# Patient Record
Sex: Female | Born: 1975
Health system: Southern US, Community
[De-identification: ages and names within clinical notes are randomized; demographics above are authoritative.]

## PROBLEM LIST (undated history)

## (undated) DIAGNOSIS — N200 Calculus of kidney: Secondary | ICD-10-CM

## (undated) DIAGNOSIS — R519 Headache, unspecified: Secondary | ICD-10-CM

## (undated) DIAGNOSIS — K219 Gastro-esophageal reflux disease without esophagitis: Secondary | ICD-10-CM

## (undated) DIAGNOSIS — F419 Anxiety disorder, unspecified: Secondary | ICD-10-CM

## (undated) DIAGNOSIS — I10 Essential (primary) hypertension: Secondary | ICD-10-CM

## (undated) DIAGNOSIS — D241 Benign neoplasm of right breast: Secondary | ICD-10-CM

## (undated) DIAGNOSIS — I493 Ventricular premature depolarization: Secondary | ICD-10-CM

## (undated) DIAGNOSIS — K429 Umbilical hernia without obstruction or gangrene: Secondary | ICD-10-CM

## (undated) DIAGNOSIS — T7840XA Allergy, unspecified, initial encounter: Secondary | ICD-10-CM

## (undated) DIAGNOSIS — R51 Headache: Secondary | ICD-10-CM

## (undated) DIAGNOSIS — Z8489 Family history of other specified conditions: Secondary | ICD-10-CM

## (undated) HISTORY — PX: WISDOM TOOTH EXTRACTION: SHX21

## (undated) HISTORY — PX: CHOLECYSTECTOMY: SHX55

## (undated) HISTORY — DX: Essential (primary) hypertension: I10

## (undated) HISTORY — DX: Allergy, unspecified, initial encounter: T78.40XA

## (undated) HISTORY — PX: LASIK: SHX215

## (undated) HISTORY — PX: EYE SURGERY: SHX253

## (undated) HISTORY — PX: CYST EXCISION: SHX5701

## (undated) HISTORY — DX: Anxiety disorder, unspecified: F41.9

## (undated) HISTORY — PX: LITHOTRIPSY: SUR834

## (undated) HISTORY — PX: DILATION AND CURETTAGE OF UTERUS: SHX78

---

## 2002-03-21 ENCOUNTER — Encounter: Payer: Self-pay | Admitting: Obstetrics and Gynecology

## 2002-03-21 ENCOUNTER — Ambulatory Visit (HOSPITAL_COMMUNITY): Admission: RE | Admit: 2002-03-21 | Discharge: 2002-03-21 | Payer: Self-pay | Admitting: Obstetrics and Gynecology

## 2002-03-27 ENCOUNTER — Inpatient Hospital Stay (HOSPITAL_COMMUNITY): Admission: AD | Admit: 2002-03-27 | Discharge: 2002-03-27 | Payer: Self-pay | Admitting: Obstetrics and Gynecology

## 2002-04-17 ENCOUNTER — Ambulatory Visit (HOSPITAL_COMMUNITY): Admission: AD | Admit: 2002-04-17 | Discharge: 2002-04-17 | Payer: Self-pay | Admitting: Obstetrics and Gynecology

## 2003-08-14 ENCOUNTER — Emergency Department (HOSPITAL_COMMUNITY): Admission: EM | Admit: 2003-08-14 | Discharge: 2003-08-14 | Payer: Self-pay | Admitting: Family Medicine

## 2003-08-24 ENCOUNTER — Other Ambulatory Visit: Admission: RE | Admit: 2003-08-24 | Discharge: 2003-08-24 | Payer: Self-pay | Admitting: Obstetrics and Gynecology

## 2003-09-26 ENCOUNTER — Emergency Department (HOSPITAL_COMMUNITY): Admission: EM | Admit: 2003-09-26 | Discharge: 2003-09-26 | Payer: Self-pay | Admitting: *Deleted

## 2004-05-10 ENCOUNTER — Emergency Department (HOSPITAL_COMMUNITY): Admission: EM | Admit: 2004-05-10 | Discharge: 2004-05-10 | Payer: Self-pay | Admitting: Family Medicine

## 2004-08-22 ENCOUNTER — Other Ambulatory Visit: Admission: RE | Admit: 2004-08-22 | Discharge: 2004-08-22 | Payer: Self-pay | Admitting: Obstetrics and Gynecology

## 2004-10-10 ENCOUNTER — Inpatient Hospital Stay (HOSPITAL_COMMUNITY): Admission: AD | Admit: 2004-10-10 | Discharge: 2004-10-10 | Payer: Self-pay | Admitting: Obstetrics and Gynecology

## 2004-11-11 ENCOUNTER — Inpatient Hospital Stay (HOSPITAL_COMMUNITY): Admission: AD | Admit: 2004-11-11 | Discharge: 2004-11-12 | Payer: Self-pay | Admitting: Obstetrics and Gynecology

## 2005-02-09 ENCOUNTER — Ambulatory Visit (HOSPITAL_COMMUNITY): Admission: RE | Admit: 2005-02-09 | Discharge: 2005-02-09 | Payer: Self-pay | Admitting: Obstetrics and Gynecology

## 2005-02-13 DIAGNOSIS — N200 Calculus of kidney: Secondary | ICD-10-CM

## 2005-02-13 HISTORY — DX: Calculus of kidney: N20.0

## 2005-02-20 ENCOUNTER — Inpatient Hospital Stay (HOSPITAL_COMMUNITY): Admission: RE | Admit: 2005-02-20 | Discharge: 2005-02-23 | Payer: Self-pay | Admitting: Obstetrics and Gynecology

## 2005-02-20 ENCOUNTER — Encounter (INDEPENDENT_AMBULATORY_CARE_PROVIDER_SITE_OTHER): Payer: Self-pay | Admitting: *Deleted

## 2005-02-24 ENCOUNTER — Encounter: Admission: RE | Admit: 2005-02-24 | Discharge: 2005-03-26 | Payer: Self-pay | Admitting: Obstetrics and Gynecology

## 2005-03-16 ENCOUNTER — Emergency Department (HOSPITAL_COMMUNITY): Admission: EM | Admit: 2005-03-16 | Discharge: 2005-03-16 | Payer: Self-pay | Admitting: Family Medicine

## 2005-03-27 ENCOUNTER — Encounter: Admission: RE | Admit: 2005-03-27 | Discharge: 2005-04-23 | Payer: Self-pay | Admitting: Obstetrics and Gynecology

## 2005-04-03 ENCOUNTER — Encounter (INDEPENDENT_AMBULATORY_CARE_PROVIDER_SITE_OTHER): Payer: Self-pay | Admitting: Specialist

## 2005-04-03 ENCOUNTER — Ambulatory Visit (HOSPITAL_COMMUNITY): Admission: RE | Admit: 2005-04-03 | Discharge: 2005-04-03 | Payer: Self-pay | Admitting: Surgery

## 2005-04-24 ENCOUNTER — Encounter: Admission: RE | Admit: 2005-04-24 | Discharge: 2005-05-24 | Payer: Self-pay | Admitting: Obstetrics and Gynecology

## 2005-05-25 ENCOUNTER — Encounter: Admission: RE | Admit: 2005-05-25 | Discharge: 2005-06-23 | Payer: Self-pay | Admitting: Obstetrics and Gynecology

## 2005-06-24 ENCOUNTER — Encounter: Admission: RE | Admit: 2005-06-24 | Discharge: 2005-07-24 | Payer: Self-pay | Admitting: Obstetrics and Gynecology

## 2005-07-25 ENCOUNTER — Encounter: Admission: RE | Admit: 2005-07-25 | Discharge: 2005-08-23 | Payer: Self-pay | Admitting: Obstetrics and Gynecology

## 2006-02-24 ENCOUNTER — Emergency Department (HOSPITAL_COMMUNITY): Admission: EM | Admit: 2006-02-24 | Discharge: 2006-02-24 | Payer: Self-pay | Admitting: Family Medicine

## 2006-04-16 ENCOUNTER — Ambulatory Visit (HOSPITAL_COMMUNITY): Admission: RE | Admit: 2006-04-16 | Discharge: 2006-04-16 | Payer: Self-pay | Admitting: Urology

## 2006-09-08 ENCOUNTER — Emergency Department (HOSPITAL_COMMUNITY): Admission: EM | Admit: 2006-09-08 | Discharge: 2006-09-08 | Payer: Self-pay | Admitting: Emergency Medicine

## 2006-11-13 ENCOUNTER — Emergency Department (HOSPITAL_COMMUNITY): Admission: EM | Admit: 2006-11-13 | Discharge: 2006-11-13 | Payer: Self-pay | Admitting: Emergency Medicine

## 2007-02-04 ENCOUNTER — Emergency Department (HOSPITAL_COMMUNITY): Admission: EM | Admit: 2007-02-04 | Discharge: 2007-02-04 | Payer: Self-pay | Admitting: Emergency Medicine

## 2008-07-22 ENCOUNTER — Ambulatory Visit: Payer: Self-pay | Admitting: Cardiology

## 2008-07-23 ENCOUNTER — Ambulatory Visit: Payer: Self-pay | Admitting: Cardiology

## 2008-08-10 LAB — CONVERTED CEMR LAB
ALT: 23 units/L (ref 0–35)
AST: 28 units/L (ref 0–37)
Albumin: 4.2 g/dL (ref 3.5–5.2)
BUN: 18 mg/dL (ref 6–23)
Basophils Relative: 0.6 % (ref 0.0–3.0)
CO2: 28 meq/L (ref 19–32)
Calcium: 9.5 mg/dL (ref 8.4–10.5)
Chloride: 109 meq/L (ref 96–112)
Eosinophils Absolute: 0.1 10*3/uL (ref 0.0–0.7)
HCT: 36.7 % (ref 36.0–46.0)
Lymphocytes Relative: 36.7 % (ref 12.0–46.0)
Lymphs Abs: 1.9 10*3/uL (ref 0.7–4.0)
MCV: 91.4 fL (ref 78.0–100.0)
Monocytes Absolute: 0.4 10*3/uL (ref 0.1–1.0)
Potassium: 3.7 meq/L (ref 3.5–5.1)
RBC: 4.02 M/uL (ref 3.87–5.11)
Total Bilirubin: 1.5 mg/dL — ABNORMAL HIGH (ref 0.3–1.2)
Total Protein: 7.2 g/dL (ref 6.0–8.3)

## 2010-01-22 ENCOUNTER — Emergency Department (HOSPITAL_COMMUNITY)
Admission: EM | Admit: 2010-01-22 | Discharge: 2010-01-22 | Payer: Self-pay | Source: Home / Self Care | Admitting: Family Medicine

## 2010-03-29 ENCOUNTER — Other Ambulatory Visit: Payer: Self-pay | Admitting: Obstetrics and Gynecology

## 2010-07-01 NOTE — Consult Note (Signed)
NAMESANDRA, Andrea Gray                ACCOUNT NO.:  [id number]   MEDICAL RECORD NO.:  [id number]          PATIENT TYPE:  EMS   LOCATION:  MAJO                         FACILITY:  [facility]   PHYSICIAN:  Gabrielle Dare. Janee Morn, M.D.DATE OF BIRTH:  [birth year]   DATE OF CONSULTATION:  03/16/2005  DATE OF DISCHARGE:  03/16/2005                                   CONSULTATION   REFERRING PHYSICIAN:  Quita Skye. Artis Flock, M.D.   REASON FOR CONSULTATION:  Gallstones and right upper quadrant pain.   HISTORY OF PRESENT ILLNESS:  The patient is a 35 year old white female who  is 3 weeks postpartum, status post a cesarean section for twins, who  developed some right upper quadrant pain earlier today after eating chili.  The patient was initially seen at [facility] and evaluated  further in the [facility] Emergency Department.  Workup included white blood  cell count of 8.8, liver function tests -- AST 52, ALT 35, alkaline  phosphatase 97, bilirubin 1.3.  Ultrasound of the abdomen was then obtained;  this shows gallstones with no evidence of acute cholecystitis.  The  patient's pain has completely resolved at this time and she has no other  complaints.   PAST MEDICAL HISTORY:  Negative.   PAST SURGICAL HISTORY:  1.  LASIK.  2.  Cesarean section.   CURRENT MEDICATIONS:  Vitamins and ibuprofen.   ALLERGIES:  PENICILLIN.   REVIEW OF SYSTEMS:  CARDIAC:  Negative.  PULMONARY:  Negative.  GI:  See the  history of present illness.  GU:  Negative.   PHYSICAL EXAMINATION:  VITAL SIGNS:  Temperature is 98.5, blood pressure  128/71, pulse 86, respirations 16 and saturation is 97%.  GENERAL:  She is awake and alert and well-appearing.  HEENT:  Pupils are equal and reactive.  Sclerae are clear.  NECK:  Supple with no tenderness.  LUNGS:  Clear to auscultation bilaterally.  HEART:  Regular and pulses palpable in the left chest.  ABDOMEN:  Soft.  There is no appreciable tenderness.  She has a C-section  scar, but no masses are noted.  SKIN:  Warm and dry with no rashes.   DATA REVIEW:  Data reviewed included laboratory studies and ultrasound  results as above.   IMPRESSION:  Symptomatic cholelithiasis with pain resolved at this time.   RECOMMENDATIONS:  Okay to discharge home.  The patient wishes to go home at  this time as she is breast-feeding.  We will have her follow up in [facility] office with one of my partners over the next few  days and she was given instructions and a card to call.  In the interim, she  is going to stay on a low-fat diet and to give [place] a call if her symptoms  return.  Further plans for laparoscopic cholecystectomy in the future were  discussed and questions were answered.  The patient's husband is a  Teacher, early years/pre.      Gabrielle Dare. Janee Morn, M.D.  Electronically Signed     BET/MEDQ  D:  03/16/2005  T:  03/17/2005  Job:  [id number] 

## 2010-07-01 NOTE — Discharge Summary (Signed)
NAMETREZURE, CRONK                ACCOUNT NO.:  [id number]   MEDICAL RECORD NO.:  [id number]          PATIENT TYPE:  INP   LOCATION:  9104                          [facility]ACILITY:  [facility]   PHYSICIAN:  Miguel Aschoff, M.D.       DATE OF BIRTH:  [birth year]   DATE OF ADMISSION:  02/20/2005  DATE OF DISCHARGE:  02/23/2005                                 DISCHARGE SUMMARY   FINAL DIAGNOSES:  1.  Twin gestation at 63 and five-sevenths weeks gestation.  2.  Vertex/transverse lie.  3.  The patient desires cesarean section.   PROCEDURE:  Primary low transverse cesarean section. Surgeon:  Dr. Carrington Clamp. Assistant:  Dr. Ilda Mori. Complications:  None.   This 35 year old G4 P0-0-3-0 presents at 35-and-a-half weeks gestation for a  cesarean section. The patient's antepartum course at this point had been  complicated by a history of IVF. The patient did have a twin gestation with  this IVF. The patient had serial ultrasounds which showed symmetric growth.  She did have a group B strep culture obtained at 35 weeks; I do not see the  results in the chart. She was scheduled for the cesarean section on February 20, 2005. She was taken to the operating room by Dr. Carrington Clamp on  February 20, 2005, where a primary low transverse cesarean section was  performed with the delivery of baby boy A in a vertex presentation weighing  5 pounds 10 ounces with Apgars of 8 and 9. At that point baby B was  delivered vertex as well weighing 5 pounds 8 ounces with Apgars of 7 and 8.  Delivery went without complications. The patient's postoperative course was  benign without any significant fevers. She was felt ready for discharge on  postoperative day #3. She was sent home on a regular diet, told to decrease  activities, told to continue her prenatal vitamins and iron supplement, was  given Tylox one to two every 4 hours as needed for pain, told she could use  over-the-counter Motrin up to 600 mg every 6 hours  as needed for pain, was  to follow up in the office in 4 weeks.   LABORATORY ON DISCHARGE:  The patient had a hemoglobin of 8.5; a white blood  cell count of 11.0; platelets of 176,000.      Leilani Able, P.A.-C.      Miguel Aschoff, M.D.  Electronically Signed    MB/MEDQ  D:  03/08/2005  T:  03/08/2005  Job:  [id number]

## 2010-07-01 NOTE — Op Note (Signed)
NAMEMANDALYN, PASQUA                ACCOUNT NO.:  [id number]   MEDICAL RECORD NO.:  [id number]          PATIENT TYPE:  AMB   LOCATION:  DAY                          [facility]ACILITY[facility]   PHYSICIAN:  Thomas A. Cornett, M.D.DATE OF BIRTH:  [birth year]   DATE OF PROCEDURE:  04/03/2005  DATE OF DISCHARGE:                                 OPERATIVE REPORT   PREOPERATIVE DIAGNOSIS:  Symptomatic cholelithiasis.   POSTOPERATIVE DIAGNOSIS:  Symptomatic cholelithiasis.   PROCEDURE:  Laparoscopic cholecystectomy and intraoperative cholangiogram.   SURGEON:  Dr. Harriette Bouillon.   ASSISTANT:  Dr. Consuello Bossier.   ANESTHESIA:  General endotracheal anesthesia with 0.25% Sensorcaine.   ESTIMATED BLOOD LOSS:  10 mL.   DRAINS:  None.   INDICATIONS FOR PROCEDURE:  The patient is a 35 year old female 6 weeks  postpartum. She had a severe attack of symptomatic cholelithiasis about  three weeks ago and was seen in the office and felt that laparoscopic  cholecystectomy was indicated for symptomatic cholelithiasis. The procedure  was discussed with the patient as well as the risks and complications and  she agreed to proceed .   DESCRIPTION OF PROCEDURE:  The patient was brought to the operating room and  placed supine. The abdomen was prepped and draped in a sterile fashion after  induction of general endotracheal anesthesia. A 1 cm infraumbilical incision  was made, dissection was carried to her fascia, incision was made in her. We  then enlarged the incision and entered her abdominal cavity after opening  the peritoneum with Metzenbaum scissors. I swept my finger around and felt  no evidence of adhesion. Pursestring suture of #0 Vicryl was placed and an  11-mm Hassan cannula was placed under direct vision. Pneumoperitoneum was  created to 15 mmHg with CO2 and a laparoscope was placed. The patient was  placed in reverse Trendelenburg and rolled to her left. Intraoperative  laparoscopy revealed no  evidence of solid organ or hollow organ injury. A 5  mm subxiphoid port was placed under direct vision. Two other 5 mm ports were  placed in the right mid abdomen under direct vision. The gallbladder was  identified and grasped by its dome and retracted toward the patient's right  shoulder. There were some filmy adhesions from the duodenum to the  gallbladder and I was able to pull these away from the gallbladder to the  duodenum without injuring the duodenum. The infundibulum was then grasped  and dissection was begun at the junction of the cystic duct and gallbladder  infundibulum. We were able to dissect this out circumferentially and a clip  was placed in the gallbladder side. A small incision was made in the cystic  duct and intraoperative cholangiogram was performed using one-half strength  Hypaque dye and fluoroscopy using a Cook catheter through a separate stab  incision. Cholangiogram revealed free flow of contrast from the cystic duct  to the common duct, common hepatic ducts to the bifurcation and then down  through the duodenum without signs of stone structure or leakage. The  cholangiogram is complete and cholangiogram catheter was withdrawn. The  cystic duct was then triple clipped and divided. The cystic artery was  identified, dissected out and it was double clipped and divided. One small  posterior branch in the cystic artery was identified and it was single  clipped and divided. Cautery was used to dissect the gallbladder from the  gallbladder fossa. The gallbladder was then placed in a EndoCatch bag and  extracted through the umbilical port using the assistance of a 5-mm scope.  This was passed off the field. We then reinserted a 10-mm scope, reinspected  the bed and found it to be hemostatic. All oozing was controlled with  cautery. Irrigation was used and suctioned out until clear. The gallbladder  bed was dry with no signs of bleeding or bile leakage. At this point in   time, all ports were withdrawn with no evidence of port site bleeding. The  camera was withdrawn, the [facility] cannula was removed and the CO2 was  released. The umbilical port was then closed with a #0 Vicryl suture, 4-0  Monocryl was used to close all skin incisions. All sponge, needle and  instruments were found to be correct at this portion of the case. The  patient was awoke and taken to recovery in satisfactory condition.      Thomas A. Cornett, M.D.  Electronically Signed     TAC/MEDQ  D:  04/03/2005  T:  04/04/2005  Job:  [id number]   cc:   Carrington Clamp, M.D.  Fax: [phone]

## 2010-07-01 NOTE — Op Note (Signed)
NAMESHERAY, Andrea Gray                ACCOUNT NO.:  [id number]   MEDICAL RECORD NO.:  [id number]          PATIENT TYPE:  INP   LOCATION:  9104                          FACILITY[facility]  [facility]   PHYSICIAN:  Carrington Clamp, M.D. DATE OF BIRTH:  [birth year]   DATE OF PROCEDURE:  02/20/2005  DATE OF DISCHARGE:                                 OPERATIVE REPORT   PREOPERATIVE DIAGNOSIS:  Twins, vertex/transverse, desires cesarean section.   POSTOPERATIVE DIAGNOSIS:  Twins, vertex/transverse, desires cesarean  section.   PROCEDURES:  Primary low transverse cesarean section.   SURGEON:  Carrington Clamp, M.D.   ASSISTANT:  Dr. Arlyce Dice.   ANESTHESIA:  Was general.   SPECIMENS:  Placenta.   ESTIMATED BLOOD LOSS:  Was 800 mL.   IV FLUIDS:  2800 mL.   URINE OUTPUT:  Was 200 mL.   COMPLICATIONS:  None.   FINDINGS:  Baby boy A vertex presentation, Apgars 8 and 9, weight 5 pounds  10 ounces. Baby B delivered vertex Apgars 07/08, weight 5 pounds 8 ounces.  Normal tubes, ovaries and uterus were seen.   MEDICATIONS:  Were Pitocin and Clindamycin. Counts were correct x3.   ANESTHESIA:  Spinal.   TECHNIQUE:  After adequate spinal anesthesia was achieved, the patient was  prepped, draped in sterile fashion in dorsal supine position with leftward  tilt. Pfannenstiel skin incision was made with the scalpel and carried down  to the fascia with the Bovie cautery. The fascia was incised in the midline  with the scalpel and carried in transverse curvilinear manner with the Mayo  scissors. Fascia was reflected superiorly inferiorly from the rectus muscles  and the rectus muscles were split in the midline. A bowel free portion of  peritoneum was entered into bluntly and the peritoneum then stretched open.  The bladder blade was placed and vesicouterine fascia was tented up and  entered into with the Metzenbaum scissors and then incised in transverse  curvilinear manner. The bladder flap was created with  blunt dissection. The  bladder blade replaced.   A 2 cm incision was made in the upper portion lower uterine segment  transversely until clear fluid was noted. This incision was extended with  the bandage scissors. Baby A left was delivered vertex and bulb suctioned  and handed to awaiting pediatrics. Clear fluid was noted on entry to sac A.  Baby B then came down vertex and was delivered after the amnion had been  ruptured. Clear fluid was seen. Baby was handed also to awaiting pediatrics.  Cord bloods were obtained. The placenta was sent to pathology.   The placenta was then delivered manually and sent to pathology. The uterus  was then exteriorized, wrapped in wet lap, cleared of all debris. The  uterine incision closed with running lock stitch of 0 Monocryl. Imbricating  layer of 0 Monocryl was used too. A figure-of-eight stitch was used to  ensure hemostasis. The uterus was then reapproximated in the abdomen. The  abdomen cleared of all debris with irrigation. The uterine incision  reinspected, found to be hemostatic.   Peritoneum was then closed with  running stitch 2-0 Vicryl. The fascia closed  with running stitch of 0 Vicryl. The subcutaneous tissue was rendered  hemostatic with Bovie cautery and irrigation. The skin was closed staples.  The patient tolerated the procedure well. She returned to recovery room in  stable condition.      Carrington Clamp, M.D.  Electronically Signed     MH/MEDQ  D:  02/20/2005  T:  02/20/2005  Job:  [id number]

## 2011-07-16 ENCOUNTER — Emergency Department (HOSPITAL_COMMUNITY)
Admission: EM | Admit: 2011-07-16 | Discharge: 2011-07-16 | Disposition: A | Discharge status: Home / Self Care | Payer: 59 | Source: Home / Self Care | Attending: Emergency Medicine | Admitting: Emergency Medicine

## 2011-07-16 ENCOUNTER — Encounter (HOSPITAL_COMMUNITY): Payer: Self-pay | Admitting: *Deleted

## 2011-07-16 ENCOUNTER — Emergency Department (INDEPENDENT_AMBULATORY_CARE_PROVIDER_SITE_OTHER): Payer: 59

## 2011-07-16 DIAGNOSIS — S63619A Unspecified sprain of unspecified finger, initial encounter: Secondary | ICD-10-CM

## 2011-07-16 DIAGNOSIS — S6390XA Sprain of unspecified part of unspecified wrist and hand, initial encounter: Secondary | ICD-10-CM

## 2011-07-16 NOTE — ED Provider Notes (Signed)
Chief Complaint  Patient presents with  . 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 She has pain and swelling over the PIP joint of the little finger. It's a little bit stiff but she is able to fully extend and fully flex. There is no numbness or tingling.  Review of Systems:  Other than noted above, the patient denies any of the following symptoms: Systemic:  No fevers, chills, sweats, or aches.  No fatigue or tiredness. Musculoskeletal:  No joint pain, arthritis, bursitis, swelling, back pain, or neck pain. Neurological:  No muscular weakness, paresthesias, headache, or trouble with speech or coordination.  No dizziness.   PMFSH:  Past medical history, family history, social history, meds, and allergies were reviewed.  Physical Exam:   Vital signs:  BP 123/83  Pulse 92  Temp(Src) 98 F (36.7 C) (Oral)  Resp 16  SpO2 100% Gen:  Alert and oriented times 3.  In no distress. Musculoskeletal: There is slight swelling and pain to palpation of the PIP joint of the right little finger. She is able to fully extend against resistance and can fully flex although it does hurt. Otherwise, all joints had a full a ROM with no swelling, bruising or deformity.  No edema, pulses full. Extremities were warm and pink.  Capillary refill was brisk.  Skin:  Clear, warm and dry.  No rash. Neuro:  Alert and oriented times 3.  Muscle strength was normal.  Sensation was intact to light touch.   Radiology:  Dg Finger Little Right  07/16/2011  *RADIOLOGY REPORT*  Clinical Data: Basketball injury, pain  RIGHT LITTLE FINGER 2+V  Comparison: None.  Findings: Normal alignment.  No fracture evident.  Preserved joint spaces.  No acute osseous finding.  IMPRESSION: No acute finding.  Original Report Authenticated By: Judie Petit. Ruel Favors, M.D.   Course in Urgent Care Center:   The finger  was placed in a finger splint in position of function.  Assessment:  The encounter diagnosis was Finger sprain.  Plan:   1.  The following meds were prescribed:   New Prescriptions   No medications on file   2.  The patient was instructed in symptomatic care, including rest and activity, elevation, application of ice and compression.  Appropriate handouts were given. 3.  The patient was told to return if becoming worse in any way, if no better in 3 or 4 days, and given some red flag symptoms that would indicate earlier return.   4.  The patient was told to follow up if necessary with her primary care physician.   Reuben Likes, MD 07/16/11 [phone]

## 2011-07-16 NOTE — ED Notes (Signed)
Pt playing basketball last night with children injured left 5th finger this am swelling and bruising increased pain

## 2011-07-16 NOTE — Discharge Instructions (Signed)
Finger Sprain A finger sprain is a tear in one of the strong, fibrous tissues that connect the bones (ligaments) in your finger. The severity of the sprain depends on how much of the ligament is torn. The tear can be either partial or complete. CAUSES  Often, sprains are a result of a fall or accident. If you extend your hands to catch an object or to protect yourself, the force of the impact causes the fibers of your ligament to stretch too much. This excess tension causes the fibers of your ligament to tear. SYMPTOMS  You may have some loss of motion in your finger. Other symptoms include:  Bruising.   Tenderness.   Swelling.  DIAGNOSIS  In order to diagnose finger sprain, your caregiver will physically examine your finger or thumb to determine how torn the ligament is. Your caregiver may also suggest an X-ray exam of your finger to make sure no bones are broken. TREATMENT  If your ligament is only partially torn, treatment usually involves keeping the finger in a fixed position (immobilization) for a short period. To do this, your caregiver will apply a bandage, cast, or splint to keep your finger from moving until it heals. For a partially torn ligament, the healing process usually takes 2 to 3 weeks. If your ligament is completely torn, you may need surgery to reconnect the ligament to the bone. After surgery a cast or splint will be applied and will need to stay on your finger or thumb for 4 to 6 weeks while your ligament heals. HOME CARE INSTRUCTIONS  Keep your injured finger elevated, when possible, to decrease swelling.   To ease pain and swelling, apply ice to your joint twice a day, for 2 to 3 days:   Put ice in a plastic bag.   Place a towel between your skin and the bag.   Leave the ice on for 15 minutes.   Only take over-the-counter or prescription medicine for pain as directed by your caregiver.   Do not wear rings on your injured finger.   Do not leave your finger  unprotected until pain and stiffness go away (usually 3 to 4 weeks).   Do not allow your cast or splint to get wet. Cover your cast or splint with a plastic bag when you shower or bathe. Do not swim.   Your caregiver may suggest special exercises for you to do during your recovery to prevent or limit permanent stiffness.  SEEK IMMEDIATE MEDICAL CARE IF:  Your cast or splint becomes damaged.   Your pain becomes worse rather than better.  MAKE SURE YOU:  Understand these instructions.   Will watch your condition.   Will get help right away if you are not doing well or get worse.  Document Released: 03/09/2004 Document Revised: 01/19/2011 Document Reviewed: 10/03/2010 [facility] Patient Information 2012 [place], [place].Finger Sprain A sprain is an injury where a ligament is over-stretched or torn. A ligament holds joints together. Finger sprains are a common injury among athletes. Sprains are classified into 3 categories. Grade 1 sprains cause pain, but the ligament is not lengthened. Grade 2 sprains include a lengthened ligament, due to stretching or partial tearing. With grade 2 sprains, there is still function, although function may be decreased. Grade 3 sprains are marked by a complete tear of the ligament. The joint usually suffers a loss of function. Severe sprains sometimes require surgery.  SYMPTOMS   Severe pain, at the time of injury.   Often, a  feeling of popping or tearing inside one or more fingers.   Tenderness, swelling, and later bruising in the finger.   Impaired ability to use the injured finger.  CAUSES  Stress, often from an unnatural degree of movement, exceeds the strength of the ligament, and the ligament is either stretched or torn. RISK INCREASES WITH:  Previous finger sprain or injury.   Contact sports and sports involving catching and throwing (i.e. baseball, basketball, football).   Poor hand strength and flexibility.   Inadequate or poorly fitting  protective equipment.  PREVENTION Taping, protective strapping, bracing, or splints may help prevent injury. PROGNOSIS  For first time injuries, sufficient healing time before resuming activity should prevent recurring injury or permanent impairment. Due to poor blood supply, ligaments do not heal well, and require a longer healing time than other structures, such as bone. Average healing times are as follows:  Grade 1: 2-6 weeks.   Grade 2: 8-12 weeks.   Grade 3: 12-16 weeks.  RELATED COMPLICATIONS   Longer healing time, if activity is resumed too soon.   Frequently recurring symptoms and repeated injury, resulting in a chronic problem.   Injury to other structures (bone, cartilage, or tendon).   Arthritis of the affected joint.   Prolonged impairment (sometimes).   Finger stiffness.  TREATMENT Treatment first consists of ice and medicine, to reduce pain and inflammation. Compression bandages and elevation may help reduce inflammation and discomfort. After swelling goes down, the joint should be restrained for a length of time prescribed by your caregiver. After restraint, stretching and strengthening exercises are needed. Exercises may be completed at home or with a therapist. Rarely, surgical treatment is needed. Taping may be advised, when returning to sports.  MEDICATION   If pain medicine is needed, nonsteroidal anti-inflammatory medicines (aspirin and ibuprofen), or other minor pain relievers (acetaminophen), are often advised.   Do not take pain medicine for 7 days before surgery.   Stronger pain relievers may be prescribed by your caregiver. Use only as directed and only as much as you need.  HEAT AND COLD  Cold treatment (icing) relieves pain and reduces inflammation. Cold treatment should be applied for 10 to 15 minutes every 2 to 3 hours, and immediately after activity that aggravates your symptoms. Use ice packs or an ice massage.   Heat treatment may be used before  performing stretching and strengthening activities prescribed by your caregiver, physical therapist, or athletic trainer. Use a heat pack or a warm water soak.  SEEK MEDICAL CARE IF:   Pain, swelling, or bruising gets worse, despite treatment, or you experience persistent pain lasting more than 2 to 4 weeks.   You experience pain, numbness, discoloration, or coldness in the hand or fingers. Blue, gray, or dark color appears in the fingernails.   Any of the following occur after surgery: increased pain, swelling, redness, drainage of fluids, bleeding in the affected area, or signs of infection, including fever.   New, unexplained symptoms develop. (Drugs used in treatment may produce side effects.)  Document Released: 01/30/2005 Document Revised: 01/19/2011 Document Reviewed: 05/14/2008 [facility] Patient Information 2012 [place], [place].

## 2013-03-07 ENCOUNTER — Ambulatory Visit (INDEPENDENT_AMBULATORY_CARE_PROVIDER_SITE_OTHER): Payer: 59

## 2013-03-07 ENCOUNTER — Encounter: Payer: Self-pay | Admitting: Podiatrist

## 2013-03-07 ENCOUNTER — Ambulatory Visit (INDEPENDENT_AMBULATORY_CARE_PROVIDER_SITE_OTHER): Payer: 59 | Admitting: Podiatrist

## 2013-03-07 DIAGNOSIS — M79609 Pain in unspecified limb: Secondary | ICD-10-CM

## 2013-03-07 DIAGNOSIS — M779 Enthesopathy, unspecified: Secondary | ICD-10-CM

## 2013-03-07 NOTE — Patient Instructions (Signed)
Wear your short boot for 2-3 weeks.  If you don't notice any improvement within that time, let me know.  We can consider a steroid injection--

## 2013-03-07 NOTE — Progress Notes (Signed)
   Subjective:    Patient ID: Andrea Gray, female    DOB: 03-Dec-1975, 38 y.o.   MRN: [id number]  HPI patient presents today complaining of pain along the left foot. She specifically has pain along the fourth metatarsal left foot and states " my left foot hurts along the side of the foot and has been going on for 3 months and feels like a cramp that wont stop and if I stand too long it hurts and some tenderness" she works as a Geophysicist/field seismologist and relates discomfort when standing. She denies any trauma or injury to the foot.    Review of Systems  Constitutional: Negative.   HENT: Negative.   Eyes: Negative.   Respiratory: Negative.   Cardiovascular: Negative.   Gastrointestinal: Negative.   Endocrine: Negative.   Genitourinary: Negative.   Musculoskeletal: Positive for back pain and neck pain.  Skin: Negative.   Allergic/Immunologic: Negative.   Neurological: Negative.   Hematological: Negative.   Psychiatric/Behavioral: Negative.        Objective:   Physical Exam Neurovascular status is intact bilateral feet with palpable pedal pulses and neurological sensation intact. She has a discrete area of discomfort and tenderness along the fourth metatarsal of the left foot. No pain with tuning fork is noted no pain with dorsal to plantar pressure on palpation is noted. X-rays are negative for fracture. No sign of dislocation is seen.      Assessment & Plan:  Tendinitis left foot  Plan: The patient wants to be aggressive with her treatment and therefore I put her in a short air fracture walker. She will wear this for 2-3 weeks. It is not improved in that time. She will call me and we can consider injection therapy. Otherwise this should be a self-limiting problem.

## 2013-04-17 ENCOUNTER — Other Ambulatory Visit: Payer: Self-pay | Admitting: Obstetrics and Gynecology

## 2013-10-22 ENCOUNTER — Other Ambulatory Visit: Payer: Self-pay | Admitting: Family Medicine

## 2013-10-22 ENCOUNTER — Ambulatory Visit
Admission: RE | Admit: 2013-10-22 | Discharge: 2013-10-22 | Disposition: A | Discharge status: Home / Self Care | Payer: 59 | Source: Ambulatory Visit | Attending: Family Medicine | Admitting: Family Medicine

## 2013-10-22 DIAGNOSIS — R05 Cough: Secondary | ICD-10-CM

## 2013-10-22 DIAGNOSIS — R059 Cough, unspecified: Secondary | ICD-10-CM

## 2013-10-27 ENCOUNTER — Ambulatory Visit (INDEPENDENT_AMBULATORY_CARE_PROVIDER_SITE_OTHER): Payer: Self-pay | Admitting: General Surgery

## 2014-04-17 ENCOUNTER — Ambulatory Visit: Payer: 59 | Admitting: Podiatrist

## 2014-05-01 ENCOUNTER — Ambulatory Visit: Payer: 59 | Admitting: Podiatrist

## 2014-07-01 ENCOUNTER — Other Ambulatory Visit: Payer: Self-pay | Admitting: Family

## 2014-07-01 DIAGNOSIS — N6310 Unspecified lump in the right breast, unspecified quadrant: Secondary | ICD-10-CM

## 2014-07-02 ENCOUNTER — Other Ambulatory Visit: Payer: Self-pay | Admitting: Family

## 2014-07-02 ENCOUNTER — Ambulatory Visit
Admission: RE | Admit: 2014-07-02 | Discharge: 2014-07-02 | Disposition: A | Discharge status: Home / Self Care | Payer: 59 | Source: Ambulatory Visit | Attending: Family | Admitting: Family

## 2014-07-02 DIAGNOSIS — N6310 Unspecified lump in the right breast, unspecified quadrant: Secondary | ICD-10-CM

## 2014-07-16 ENCOUNTER — Ambulatory Visit
Admission: RE | Admit: 2014-07-16 | Discharge: 2014-07-16 | Disposition: A | Discharge status: Home / Self Care | Payer: 59 | Source: Ambulatory Visit | Attending: Family | Admitting: Family

## 2014-07-16 DIAGNOSIS — N6310 Unspecified lump in the right breast, unspecified quadrant: Secondary | ICD-10-CM

## 2015-02-04 ENCOUNTER — Telehealth: Payer: 59 | Admitting: Physician Assistant

## 2015-02-04 DIAGNOSIS — J069 Acute upper respiratory infection, unspecified: Secondary | ICD-10-CM | POA: Diagnosis not present

## 2015-02-04 NOTE — Progress Notes (Signed)
Patient with cold symptoms potentially a viral URI. Is out of state in [place] and therefore cannot treat. Supportive measures will be given to patient.

## 2015-02-06 MED ORDER — AZITHROMYCIN 250 MG PO TABS: ORAL_TABLET | ORAL | Status: DC

## 2015-02-06 NOTE — Addendum Note (Signed)
Addended by: Chevis Pretty on: 02/06/2015 01:08 PM   Modules accepted: Orders

## 2015-02-06 NOTE — Progress Notes (Signed)

## 2015-02-12 ENCOUNTER — Telehealth: Payer: 59 | Admitting: Family

## 2015-02-12 DIAGNOSIS — J069 Acute upper respiratory infection, unspecified: Secondary | ICD-10-CM | POA: Diagnosis not present

## 2015-02-12 MED ORDER — LEVOFLOXACIN 500 MG PO TABS: 500.0000 mg | ORAL_TABLET | Freq: Every day | ORAL | Status: DC

## 2015-02-12 MED ORDER — BENZONATATE 100 MG PO CAPS: 100.0000 mg | ORAL_CAPSULE | Freq: Three times a day (TID) | ORAL | Status: DC | PRN

## 2015-02-12 NOTE — Progress Notes (Signed)
We are sorry that you are not feeling well.  Here is how we plan to help!  Based on what you have shared with me it looks like you have upper respiratory tract inflammation that has resulted in a significant cough.  Inflammation and infection in the upper respiratory tract is commonly called bronchitis and has four common causes:  Allergies, Viral Infections, Acid Reflux and Bacterial Infections.  Allergies, viruses and acid reflux are treated by controlling symptoms or eliminating the cause. An example might be a cough caused by taking certain blood pressure medications. You stop the cough by changing the medication. Another example might be a cough caused by acid reflux. Controlling the reflux helps control the cough.  Based on your presentation I believe you most likely have A cough due to bacteria.  When patients have a fever and a productive cough with a change in color or increased sputum production, we are concerned about bacterial bronchitis.  If left untreated it can progress to pneumonia.  If your symptoms do not improve with your treatment plan it is important that you contact your provider.   I hve prescribed Levofloxacin 500 mg daily for 7 days   In addition you may use A prescription cough medication called Tessalon Perles 100mg . You may take 1-2 capsules every 8 hours as needed for your cough.    HOME CARE . Only take medications as instructed by your medical team. . Complete the entire course of an antibiotic. . Drink plenty of fluids and get plenty of rest. . Avoid close contacts especially the very young and the elderly . Cover your mouth if you cough or cough into your sleeve. . Always remember to wash your hands . A steam or ultrasonic humidifier can help congestion.    GET HELP RIGHT AWAY IF: . You develop worsening fever. . You become short of breath . You cough up blood. . Your symptoms persist after you have completed your treatment plan MAKE SURE YOU   Understand  these instructions.  Will watch your condition.  Will get help right away if you are not doing well or get worse.  Your e-visit answers were reviewed by a board certified advanced clinical practitioner to complete your personal care plan.  Depending on the condition, your plan could have included both over the counter or prescription medications. If there is a problem please reply  once you have received a response from your provider. Your safety is important to [place].  If you have drug allergies check your prescription carefully.    You can use MyChart to ask questions about today's visit, request a non-urgent call back, or ask for a work or school excuse for 24 hours related to this e-Visit. If it has been greater than 24 hours you will need to follow up with your provider, or enter a new e-Visit to address those concerns. You will get an e-mail in the next two days asking about your experience.  I hope that your e-visit has been valuable and will speed your recovery. Thank you for using e-visits.

## 2015-02-14 HISTORY — PX: BREAST SURGERY: SHX581

## 2015-03-23 DIAGNOSIS — B9789 Other viral agents as the cause of diseases classified elsewhere: Secondary | ICD-10-CM | POA: Diagnosis not present

## 2015-03-23 DIAGNOSIS — J069 Acute upper respiratory infection, unspecified: Secondary | ICD-10-CM | POA: Diagnosis not present

## 2015-03-23 DIAGNOSIS — J029 Acute pharyngitis, unspecified: Secondary | ICD-10-CM | POA: Diagnosis not present

## 2015-05-06 ENCOUNTER — Other Ambulatory Visit: Payer: Self-pay

## 2015-05-06 DIAGNOSIS — Z1231 Encounter for screening mammogram for malignant neoplasm of breast: Secondary | ICD-10-CM

## 2015-05-06 MED FILL — NORETHINDRONE 0.35 MG TAB: 0.35 | 84 days supply | Qty: 84 | Fill #0

## 2015-05-07 DIAGNOSIS — J069 Acute upper respiratory infection, unspecified: Secondary | ICD-10-CM | POA: Diagnosis not present

## 2015-05-19 ENCOUNTER — Ambulatory Visit: Payer: 59 | Admitting: Podiatry

## 2015-05-20 ENCOUNTER — Other Ambulatory Visit: Payer: Self-pay | Admitting: Obstetrics and Gynecology

## 2015-05-20 DIAGNOSIS — Z6830 Body mass index (BMI) 30.0-30.9, adult: Secondary | ICD-10-CM | POA: Diagnosis not present

## 2015-05-20 DIAGNOSIS — Z01419 Encounter for gynecological examination (general) (routine) without abnormal findings: Secondary | ICD-10-CM | POA: Diagnosis not present

## 2015-05-20 DIAGNOSIS — N760 Acute vaginitis: Secondary | ICD-10-CM | POA: Diagnosis not present

## 2015-07-06 ENCOUNTER — Ambulatory Visit
Admission: RE | Admit: 2015-07-06 | Discharge: 2015-07-06 | Disposition: A | Discharge status: Home / Self Care | Payer: 59 | Source: Ambulatory Visit

## 2015-07-06 DIAGNOSIS — Z1231 Encounter for screening mammogram for malignant neoplasm of breast: Secondary | ICD-10-CM

## 2015-07-07 ENCOUNTER — Other Ambulatory Visit: Payer: Self-pay | Admitting: Obstetrics and Gynecology

## 2015-07-07 DIAGNOSIS — R928 Other abnormal and inconclusive findings on diagnostic imaging of breast: Secondary | ICD-10-CM

## 2015-07-08 DIAGNOSIS — H5202 Hypermetropia, left eye: Secondary | ICD-10-CM | POA: Diagnosis not present

## 2015-07-08 DIAGNOSIS — H5211 Myopia, right eye: Secondary | ICD-10-CM | POA: Diagnosis not present

## 2015-07-08 DIAGNOSIS — H52223 Regular astigmatism, bilateral: Secondary | ICD-10-CM | POA: Diagnosis not present

## 2015-07-15 ENCOUNTER — Ambulatory Visit
Admission: RE | Admit: 2015-07-15 | Discharge: 2015-07-15 | Disposition: A | Discharge status: Home / Self Care | Payer: 59 | Source: Ambulatory Visit | Attending: Obstetrics and Gynecology | Admitting: Obstetrics and Gynecology

## 2015-07-15 DIAGNOSIS — R928 Other abnormal and inconclusive findings on diagnostic imaging of breast: Secondary | ICD-10-CM

## 2015-07-15 DIAGNOSIS — N63 Unspecified lump in breast: Secondary | ICD-10-CM | POA: Diagnosis not present

## 2015-07-28 DIAGNOSIS — D241 Benign neoplasm of right breast: Secondary | ICD-10-CM | POA: Diagnosis not present

## 2015-08-09 MED FILL — NORETHINDRONE 0.35 MG TAB: 0.35 | 84 days supply | Qty: 84 | Fill #1

## 2015-08-31 ENCOUNTER — Encounter: Payer: Self-pay | Admitting: Podiatry

## 2015-09-01 NOTE — Telephone Encounter (Signed)
Unable to contact pt through My Chart,had been removed.  Left message [phone] informing pt to call her PCP for prescription strength Omeprazole.

## 2015-09-06 MED FILL — OMEPRAZOLE DR 20 MG CAPSULE: 20 | 90 days supply | Qty: 90 | Fill #0

## 2015-10-26 NOTE — Pre-Procedure Instructions (Signed)
Andrea Gray  10/26/2015      [facility] [address] [place] [address] Phone: [phone] Fax: [phone]  [place], [place] - [address] [address] [place] [address] Phone: [phone] Fax: [phone]    Your procedure is scheduled on Thursday, October 28, 2015  Report to [facility] Admitting at 9:30 A.M.  Call this number if you have problems the morning of surgery:  [phone]   Remember:  Do not eat food or drink liquids after midnight.  Take these medicines the morning of surgery with A SIP OF WATER : Omeprazole ( Prilosec) Stop taking Aspirin, vitamins, fish oil and herbal medications. Do not take any NSAIDs ie: Ibuprofen, Advil, Naproxen, BC and Goody Powder or any medication containing Aspirin; stop now.  Do not wear jewelry, make-up or nail polish.  Do not wear lotions, powders, or perfumes, or deoderant.  Do not shave 48 hours prior to surgery.   Do not bring valuables to the hospital.  [facility] is not responsible for any belongings or valuables.  Contacts, dentures or bridgework may not be worn into surgery.  Leave your suitcase in the car.  After surgery it may be brought to your room.  For patients admitted to the hospital, discharge time will be determined by your treatment team.  Patients discharged the day of surgery will not be allowed to drive home.   Name and phone number of your driver:    Special instructions:   [facility] - Preparing for Surgery  Before surgery, you can play an important role.  Because skin is not sterile, your skin needs to be as free of germs as possible.  You can reduce the number of germs on you skin by washing with CHG (chlorahexidine gluconate) soap before surgery.  CHG is an antiseptic cleaner which kills germs and bonds with the skin to continue killing germs even  after washing.  Please DO NOT use if you have an allergy to CHG or antibacterial soaps.  If your skin becomes reddened/irritated stop using the CHG and inform your nurse when you arrive at Short Stay.  Do not shave (including legs and underarms) for at least 48 hours prior to the first CHG shower.  You may shave your face.  Please follow these instructions carefully:   1.  Shower with CHG Soap the night before surgery and the morning of Surgery.  2.  If you choose to wash your hair, wash your hair first as usual with your normal shampoo.  3.  After you shampoo, rinse your hair and body thoroughly to remove the Shampoo.  4.  Use CHG as you would any other liquid soap.  You can apply chg directly  to the skin and wash gently with scrungie or a clean washcloth.  5.  Apply the CHG Soap to your body ONLY FROM THE NECK DOWN.  Do not use on open wounds or open sores.  Avoid contact with your eyes, ears, mouth and genitals (private parts).  Wash genitals (private parts) with your normal soap.  6.  Wash thoroughly, paying special attention to the area where your surgery will be performed.  7.  Thoroughly rinse your body with warm water from the neck down.  8.  DO NOT shower/wash with your normal soap after using and rinsing off the CHG Soap.  9.  Pat yourself dry with a clean towel.            10.  Wear clean pajamas.            11.  Place clean sheets on your bed the night of your first shower and do not sleep with pets.  Day of Surgery  Do not apply any lotions/deodorants the morning of surgery.  Please wear clean clothes to the hospital/surgery center.  Please read over the following fact sheets that you were given. Pain Booklet, Coughing and Deep Breathing and Surgical Site Infection Prevention

## 2015-10-27 ENCOUNTER — Ambulatory Visit: Payer: Self-pay | Admitting: General Surgery

## 2015-10-27 ENCOUNTER — Encounter (HOSPITAL_COMMUNITY)
Admission: RE | Admit: 2015-10-27 | Discharge: 2015-10-27 | Disposition: A | Discharge status: Home / Self Care | Payer: 59 | Source: Ambulatory Visit | Attending: General Surgery | Admitting: General Surgery

## 2015-10-27 ENCOUNTER — Encounter (HOSPITAL_COMMUNITY): Payer: Self-pay

## 2015-10-27 DIAGNOSIS — Z6830 Body mass index (BMI) 30.0-30.9, adult: Secondary | ICD-10-CM | POA: Diagnosis not present

## 2015-10-27 DIAGNOSIS — K219 Gastro-esophageal reflux disease without esophagitis: Secondary | ICD-10-CM | POA: Diagnosis not present

## 2015-10-27 DIAGNOSIS — D241 Benign neoplasm of right breast: Secondary | ICD-10-CM | POA: Diagnosis not present

## 2015-10-27 HISTORY — DX: Headache, unspecified: R51.9

## 2015-10-27 HISTORY — DX: Headache: R51

## 2015-10-27 HISTORY — DX: Benign neoplasm of right breast: D24.1

## 2015-10-27 HISTORY — DX: Umbilical hernia without obstruction or gangrene: K42.9

## 2015-10-27 HISTORY — DX: Family history of other specified conditions: Z84.89

## 2015-10-27 HISTORY — DX: Ventricular premature depolarization: I49.3

## 2015-10-27 HISTORY — DX: Calculus of kidney: N20.0

## 2015-10-27 HISTORY — DX: Gastro-esophageal reflux disease without esophagitis: K21.9

## 2015-10-27 LAB — CBC
HEMATOCRIT: 42.4 % (ref 36.0–46.0)
HEMOGLOBIN: 13.6 g/dL (ref 12.0–15.0)
MCH: 31.9 pg (ref 26.0–34.0)
MCHC: 32.1 g/dL (ref 30.0–36.0)
MCV: 99.5 fL (ref 78.0–100.0)
Platelets: 252 10*3/uL (ref 150–400)
RBC: 4.26 MIL/uL (ref 3.87–5.11)
RDW: 12.2 % (ref 11.5–15.5)
WBC: 6.1 10*3/uL (ref 4.0–10.5)

## 2015-10-27 LAB — BASIC METABOLIC PANEL
ANION GAP: 8 (ref 5–15)
BUN: 10 mg/dL (ref 6–20)
CO2: 27 mmol/L (ref 22–32)
Calcium: 9.7 mg/dL (ref 8.9–10.3)
Chloride: 105 mmol/L (ref 101–111)
Creatinine, Ser: 0.93 mg/dL (ref 0.44–1.00)
GFR calc Af Amer: >60 mL/min (ref >60)
GLUCOSE: 94 mg/dL (ref 65–99)
POTASSIUM: 3.4 mmol/L — AB (ref 3.5–5.1)
Sodium: 140 mmol/L (ref 135–145)

## 2015-10-27 LAB — HCG, SERUM, QUALITATIVE: Preg, Serum: NEGATIVE

## 2015-10-27 NOTE — Progress Notes (Signed)
Pt denies SOB, chest pain, and being under the care of a cardiologist. Pt denies having a stress test, echo and cardiac cath. Pt denies having a chest x ray and EKG within the last year. Pt denies having any recent labs. Ebony Hail, PA, Anesthesia, advised that a BMET be drawn since pt has a history of PVC's. Spoke with Hassan Rowan regarding orders.

## 2015-10-27 NOTE — H&P (Signed)
Andrea Gray. Facenda  DOB: [birth year] Married / Language: English / Race: White Female   History of Present Illness  The patient is a 40 year old female.  Note:She was referred by Dr. Evangeline Dakin for consultation regarding an enlarging right breast fibroadenoma. She discovered a mass in her right breast at the 8:30 position on self-exam. Images demonstrated a 1.8 cm solid mass. Biopsy was consistent with a fibroadenoma. She states it has been slowly getting larger. She has a family history of breast cancer in a maternal aunt. She is not having any significant pain or skin dimpling from the mass.  Other Problems  Anxiety Disorder Cholelithiasis Kidney Stone Lump In Breast Umbilical Hernia Repair  Past Surgical History  Breast Biopsy Right. Cesarean Section - 1 Gallbladder Surgery - Laparoscopic Oral Surgery  Diagnostic Studies History  Colonoscopy never Mammogram within last year Pap Smear 1-5 years ago  Allergies Penicillin   Current Outpatient Prescriptions:  .  ibuprofen (ADVIL,MOTRIN) 200 MG tablet, Take 400 mg by mouth every 6 (six) hours as needed for mild pain., Disp: , Rfl:  .  norethindrone (MICRONOR,CAMILA,ERRIN) 0.35 MG tablet, Take 1 tablet by mouth every evening., Disp: , Rfl: 4 .  omeprazole (PRILOSEC) 20 MG capsule, Take 20 mg by mouth daily as needed for heartburn., Disp: , Rfl: 0   Social History Alcohol use Moderate alcohol use. Caffeine use Coffee, Tea. No drug use Tobacco use Never smoker.  Family History  Alcohol Abuse Brother. Anesthetic complications Father. Arthritis Father. Depression Brother, Mother. Diabetes Mellitus Mother. Hypertension Father, Mother. Migraine Headache Mother. Respiratory Condition Mother.  Pregnancy / Birth History  Age at menarche 69 years. Contraceptive History Intrauterine device, Oral contraceptives. Gravida 4 Irregular periods Maternal age 63-30 Para 2   Physical Exam   The physical exam findings are as follows: Note:General: Overweight female in NAD. Pleasant and cooperative.  HEENT: [place]/AT, no facial masses  NECK: Supple, no obvious mass or thyroid enlargement.  CV: RRR, no murmur, no JVD.  CHEST: Breath sounds equal and clear. Respirations nonlabored.  BREASTS: Symmetrical in size. There is a palpable, mobile approximately 2 cm mass 8:30 position right breast. No suspicious skin changes. No masses palpable and left breast.  LYMPHATIC: No palpable cervical, supraclavicular, axillary adenopathy.  SKIN: No jaundice.  NEUROLOGIC: Alert and oriented, answers questions appropriately.  PSYCHIATRIC: Normal mood, affect , and behavior.    Assessment & Plan  FIBROADENOMA OF RIGHT BREAST (D24.1) Impression: This has been getting larger since her diagnosis last year.  Plan: Excision of right breast fibroadenoma. We will try to hide the scar. I have explained the procedure, risks, and aftercare to her. Risks include but are not limited to bleeding, infection, wound problems, cosmetic deformity, anesthesia. She seems to [facility] and agrees with the plan.  Jackolyn Confer, MD

## 2015-10-28 ENCOUNTER — Ambulatory Visit (HOSPITAL_COMMUNITY): Payer: 59 | Admitting: Certified Registered Nurse Anesthetist

## 2015-10-28 ENCOUNTER — Ambulatory Visit (HOSPITAL_COMMUNITY)
Admission: RE | Admit: 2015-10-28 | Discharge: 2015-10-28 | Disposition: A | Discharge status: Home / Self Care | Payer: 59 | Source: Ambulatory Visit | Attending: General Surgery | Admitting: General Surgery

## 2015-10-28 ENCOUNTER — Encounter (HOSPITAL_COMMUNITY): Payer: Self-pay | Admitting: Certified Registered Nurse Anesthetist

## 2015-10-28 ENCOUNTER — Encounter (HOSPITAL_COMMUNITY)
Admission: RE | Disposition: A | Discharge status: Home / Self Care | Payer: Self-pay | Source: Ambulatory Visit | Attending: General Surgery

## 2015-10-28 DIAGNOSIS — N6011 Diffuse cystic mastopathy of right breast: Secondary | ICD-10-CM | POA: Diagnosis not present

## 2015-10-28 DIAGNOSIS — D241 Benign neoplasm of right breast: Secondary | ICD-10-CM | POA: Diagnosis not present

## 2015-10-28 DIAGNOSIS — Z6830 Body mass index (BMI) 30.0-30.9, adult: Secondary | ICD-10-CM | POA: Insufficient documentation

## 2015-10-28 DIAGNOSIS — K219 Gastro-esophageal reflux disease without esophagitis: Secondary | ICD-10-CM | POA: Insufficient documentation

## 2015-10-28 HISTORY — PX: MASS EXCISION: SHX2000

## 2015-10-28 SURGERY — EXCISION MASS
Anesthesia: General | Site: Breast | Laterality: Right

## 2015-10-28 MED ORDER — PHENYLEPHRINE HCL 10 MG/ML IJ SOLN
INTRAMUSCULAR | Status: DC | PRN
  Administered 2015-10-28 (×3): 80 ug via INTRAVENOUS

## 2015-10-28 MED ORDER — CHLORHEXIDINE GLUCONATE CLOTH 2 % EX PADS: 6.0000 | MEDICATED_PAD | Freq: Once | CUTANEOUS | Status: DC

## 2015-10-28 MED ORDER — FENTANYL CITRATE (PF) 100 MCG/2ML IJ SOLN
INTRAMUSCULAR | Status: AC
  Filled 2015-10-28: qty 2

## 2015-10-28 MED ORDER — HYDROMORPHONE HCL 1 MG/ML IJ SOLN
INTRAMUSCULAR | Status: AC
  Filled 2015-10-28: qty 1

## 2015-10-28 MED ORDER — BUPIVACAINE HCL (PF) 0.5 % IJ SOLN
INTRAMUSCULAR | Status: DC | PRN
  Administered 2015-10-28: 14 mL

## 2015-10-28 MED ORDER — OXYCODONE HCL 5 MG PO TABS
5.0000 mg | ORAL_TABLET | ORAL | Status: DC | PRN
  Administered 2015-10-28: 5 mg via ORAL

## 2015-10-28 MED ORDER — HYDROCODONE-ACETAMINOPHEN 5-325 MG PO TABS: 1.0000 | ORAL_TABLET | ORAL | 0 refills | Status: DC | PRN

## 2015-10-28 MED ORDER — PROPOFOL 10 MG/ML IV BOLUS
INTRAVENOUS | Status: AC
  Filled 2015-10-28: qty 40

## 2015-10-28 MED ORDER — CELECOXIB 200 MG PO CAPS
400.0000 mg | ORAL_CAPSULE | ORAL | Status: AC
  Administered 2015-10-28: 400 mg via ORAL
  Filled 2015-10-28: qty 2

## 2015-10-28 MED ORDER — HYDROMORPHONE HCL 1 MG/ML IJ SOLN
INTRAMUSCULAR | Status: DC | PRN
  Administered 2015-10-28 (×5): .2 mg via INTRAVENOUS

## 2015-10-28 MED ORDER — OXYCODONE HCL 5 MG PO TABS
ORAL_TABLET | ORAL | Status: AC
  Filled 2015-10-28: qty 2

## 2015-10-28 MED ORDER — PROMETHAZINE HCL 25 MG/ML IJ SOLN: 6.2500 mg | INTRAMUSCULAR | Status: DC | PRN

## 2015-10-28 MED ORDER — PHENYLEPHRINE 40 MCG/ML (10ML) SYRINGE FOR IV PUSH (FOR BLOOD PRESSURE SUPPORT)
PREFILLED_SYRINGE | INTRAVENOUS | Status: AC
  Filled 2015-10-28: qty 10

## 2015-10-28 MED ORDER — SCOPOLAMINE 1 MG/3DAYS TD PT72
1.0000 | MEDICATED_PATCH | Freq: Once | TRANSDERMAL | Status: DC
  Administered 2015-10-28: 1.5 mg via TRANSDERMAL

## 2015-10-28 MED ORDER — PROPOFOL 500 MG/50ML IV EMUL
INTRAVENOUS | Status: DC | PRN
  Administered 2015-10-28: 25 ug/kg/min via INTRAVENOUS

## 2015-10-28 MED ORDER — EPHEDRINE SULFATE 50 MG/ML IJ SOLN: INTRAMUSCULAR | Status: DC | PRN

## 2015-10-28 MED ORDER — METOPROLOL TARTRATE 5 MG/5ML IV SOLN
INTRAVENOUS | Status: AC
  Filled 2015-10-28: qty 5

## 2015-10-28 MED ORDER — GLYCOPYRROLATE 0.2 MG/ML IJ SOLN
INTRAMUSCULAR | Status: DC | PRN
  Administered 2015-10-28: 0.1 mg via INTRAVENOUS

## 2015-10-28 MED ORDER — MIDAZOLAM HCL 2 MG/2ML IJ SOLN: 0.5000 mg | Freq: Once | INTRAMUSCULAR | Status: DC | PRN

## 2015-10-28 MED ORDER — SCOPOLAMINE 1 MG/3DAYS TD PT72
MEDICATED_PATCH | TRANSDERMAL | Status: DC
Start: 2015-10-28 — End: 2015-10-28
  Administered 2015-10-28: 1.5 mg via TRANSDERMAL
  Filled 2015-10-28: qty 1

## 2015-10-28 MED ORDER — LIDOCAINE 2% (20 MG/ML) 5 ML SYRINGE
INTRAMUSCULAR | Status: AC
  Filled 2015-10-28: qty 5

## 2015-10-28 MED ORDER — ONDANSETRON HCL 4 MG/2ML IJ SOLN
INTRAMUSCULAR | Status: DC | PRN
  Administered 2015-10-28: 4 mg via INTRAVENOUS

## 2015-10-28 MED ORDER — ONDANSETRON HCL 4 MG/2ML IJ SOLN
INTRAMUSCULAR | Status: AC
  Filled 2015-10-28: qty 2

## 2015-10-28 MED ORDER — MIDAZOLAM HCL 2 MG/2ML IJ SOLN
INTRAMUSCULAR | Status: DC | PRN
  Administered 2015-10-28 (×2): 1 mg via INTRAVENOUS

## 2015-10-28 MED ORDER — PROPOFOL 10 MG/ML IV BOLUS
INTRAVENOUS | Status: DC | PRN
  Administered 2015-10-28: 200 mg via INTRAVENOUS

## 2015-10-28 MED ORDER — GLYCOPYRROLATE 0.2 MG/ML IV SOSY
PREFILLED_SYRINGE | INTRAVENOUS | Status: AC
  Filled 2015-10-28: qty 3

## 2015-10-28 MED ORDER — METOPROLOL TARTARATE 1 MG/ML SYRINGE (5ML)
Status: DC | PRN
  Administered 2015-10-28 (×4): 1 mg via INTRAVENOUS

## 2015-10-28 MED ORDER — GABAPENTIN 300 MG PO CAPS
300.0000 mg | ORAL_CAPSULE | ORAL | Status: AC
  Administered 2015-10-28: 300 mg via ORAL
  Filled 2015-10-28: qty 1

## 2015-10-28 MED ORDER — VANCOMYCIN HCL IN DEXTROSE 1-5 GM/200ML-% IV SOLN
1000.0000 mg | INTRAVENOUS | Status: AC
  Administered 2015-10-28: 1000 mg via INTRAVENOUS
  Filled 2015-10-28: qty 200

## 2015-10-28 MED ORDER — BUPIVACAINE HCL (PF) 0.5 % IJ SOLN
INTRAMUSCULAR | Status: AC
  Filled 2015-10-28: qty 30

## 2015-10-28 MED ORDER — LACTATED RINGERS IV SOLN
INTRAVENOUS | Status: DC | PRN
  Administered 2015-10-28 (×2): via INTRAVENOUS

## 2015-10-28 MED ORDER — BUPIVACAINE-EPINEPHRINE (PF) 0.25% -1:200000 IJ SOLN
INTRAMUSCULAR | Status: AC
  Filled 2015-10-28: qty 30

## 2015-10-28 MED ORDER — HYDROMORPHONE HCL 1 MG/ML IJ SOLN
0.2500 mg | INTRAMUSCULAR | Status: DC | PRN
  Administered 2015-10-28: 0.5 mg via INTRAVENOUS

## 2015-10-28 MED ORDER — ACETAMINOPHEN 500 MG PO TABS
1000.0000 mg | ORAL_TABLET | ORAL | Status: AC
  Administered 2015-10-28: 1000 mg via ORAL
  Filled 2015-10-28: qty 2

## 2015-10-28 MED ORDER — MIDAZOLAM HCL 2 MG/2ML IJ SOLN
INTRAMUSCULAR | Status: AC
  Filled 2015-10-28: qty 2

## 2015-10-28 MED ORDER — FENTANYL CITRATE (PF) 100 MCG/2ML IJ SOLN
INTRAMUSCULAR | Status: DC | PRN
  Administered 2015-10-28: 100 ug via INTRAVENOUS

## 2015-10-28 MED ORDER — LIDOCAINE HCL (CARDIAC) 20 MG/ML IV SOLN
INTRAVENOUS | Status: DC | PRN
  Administered 2015-10-28: 30 mg via INTRATRACHEAL

## 2015-10-28 MED ORDER — 0.9 % SODIUM CHLORIDE (POUR BTL) OPTIME
TOPICAL | Status: DC | PRN
  Administered 2015-10-28: 300 mL

## 2015-10-28 MED ORDER — MEPERIDINE HCL 25 MG/ML IJ SOLN: 6.2500 mg | INTRAMUSCULAR | Status: DC | PRN

## 2015-10-28 MED FILL — HYDROCODON-APAP 5-325: 5-325 | 4 days supply | Qty: 30 | Fill #0

## 2015-10-28 SURGICAL SUPPLY — 41 items
APL SKNCLS STERI-STRIP NONHPOA (GAUZE/BANDAGES/DRESSINGS)
BENZOIN TINCTURE PRP APPL 2/3 (GAUZE/BANDAGES/DRESSINGS) IMPLANT
BINDER BREAST LRG (GAUZE/BANDAGES/DRESSINGS) ×2 IMPLANT
BLADE SURG ROTATE 9660 (MISCELLANEOUS) IMPLANT
CLOSURE WOUND 1/2 X4 (GAUZE/BANDAGES/DRESSINGS)
COVER SURGICAL LIGHT HANDLE (MISCELLANEOUS) ×3 IMPLANT
DRAPE LAPAROTOMY T 98X78 PEDS (DRAPES) IMPLANT
DRAPE ORTHO SPLIT 77X108 STRL (DRAPES)
DRAPE SURG ORHT 6 SPLT 77X108 (DRAPES) IMPLANT
DRAPE UTILITY XL STRL (DRAPES) ×6 IMPLANT
ELECT CAUTERY BLADE 6.4 (BLADE) ×3 IMPLANT
ELECT REM PT RETURN 9FT ADLT (ELECTROSURGICAL) ×3
ELECTRODE REM PT RTRN 9FT ADLT (ELECTROSURGICAL) ×1 IMPLANT
GAUZE SPONGE 4X4 12PLY STRL (GAUZE/BANDAGES/DRESSINGS) IMPLANT
GLOVE BIOGEL PI IND STRL 8 (GLOVE) ×1 IMPLANT
GLOVE BIOGEL PI INDICATOR 8 (GLOVE) ×2
GLOVE ECLIPSE 8.0 STRL XLNG CF (GLOVE) ×3 IMPLANT
GOWN STRL REUS W/ TWL LRG LVL3 (GOWN DISPOSABLE) ×2 IMPLANT
GOWN STRL REUS W/TWL LRG LVL3 (GOWN DISPOSABLE) ×6
KIT BASIN OR (CUSTOM PROCEDURE TRAY) ×3 IMPLANT
KIT ROOM TURNOVER OR (KITS) ×3 IMPLANT
LIQUID BAND (GAUZE/BANDAGES/DRESSINGS) IMPLANT
NS IRRIG 1000ML POUR BTL (IV SOLUTION) ×3 IMPLANT
PACK SURGICAL SETUP 50X90 (CUSTOM PROCEDURE TRAY) ×3 IMPLANT
PAD ARMBOARD 7.5X6 YLW CONV (MISCELLANEOUS) ×3 IMPLANT
PENCIL BUTTON HOLSTER BLD 10FT (ELECTRODE) ×3 IMPLANT
SPECIMEN JAR SMALL (MISCELLANEOUS) ×3 IMPLANT
SPONGE GAUZE 4X4 12PLY STER LF (GAUZE/BANDAGES/DRESSINGS) ×2 IMPLANT
SPONGE LAP 18X18 X RAY DECT (DISPOSABLE) ×3 IMPLANT
STRIP CLOSURE SKIN 1/2X4 (GAUZE/BANDAGES/DRESSINGS) IMPLANT
SUT ETHILON 3 0 FSL (SUTURE) IMPLANT
SUT ETHILON 4 0 PS 2 18 (SUTURE) IMPLANT
SUT MNCRL AB 3-0 PS2 18 (SUTURE) IMPLANT
SUT MON AB 4-0 PC3 18 (SUTURE) IMPLANT
SUT VIC AB 2-0 SH 27 (SUTURE)
SUT VIC AB 2-0 SH 27X BRD (SUTURE) IMPLANT
SUT VIC AB 3-0 SH 27 (SUTURE)
SUT VIC AB 3-0 SH 27XBRD (SUTURE) IMPLANT
TOWEL OR 17X24 6PK STRL BLUE (TOWEL DISPOSABLE) ×3 IMPLANT
TOWEL OR 17X26 10 PK STRL BLUE (TOWEL DISPOSABLE) ×3 IMPLANT
UNDERPAD 30X30 (UNDERPADS AND DIAPERS) IMPLANT

## 2015-10-28 NOTE — Anesthesia Postprocedure Evaluation (Signed)
Anesthesia Post Note  Patient: Andrea Gray  Procedure(s) Performed: Procedure(s) (LRB): EXCISION OF RIGHT BREAST FIBROADENOMA (Right)  Patient location during evaluation: PACU Anesthesia Type: General Level of consciousness: awake and alert, oriented and patient cooperative Pain management: pain level controlled Vital Signs Assessment: post-procedure vital signs reviewed and stable Respiratory status: spontaneous breathing, nonlabored ventilation and respiratory function stable Cardiovascular status: blood pressure returned to baseline and stable Postop Assessment: no signs of nausea or vomiting Anesthetic complications: no    Last Vitals:  Vitals:   10/28/15 1445 10/28/15 1455  BP:  121/72  Pulse: 84 88  Resp: 14 16  Temp:      Last Pain:  Vitals:   10/28/15 1455  TempSrc:   PainSc: 2                  Morrie Daywalt,E. Aeden Matranga

## 2015-10-28 NOTE — Anesthesia Preprocedure Evaluation (Signed)
Anesthesia Evaluation  Patient identified by MRN, date of birth, ID band Patient awake    Reviewed: Allergy & Precautions, NPO status , Patient's Chart, lab work & pertinent test results  History of Anesthesia Complications (+) PONV and history of anesthetic complications  Airway Mallampati: I  TM Distance: >3 FB Neck ROM: Full    Dental  (+) Teeth Intact, Dental Advisory Given   Pulmonary neg pulmonary ROS,    breath sounds clear to auscultation       Cardiovascular negative cardio ROS   Rhythm:Regular Rate:Normal     Neuro/Psych negative neurological ROS     GI/Hepatic Neg liver ROS, GERD  Medicated and Controlled,  Endo/Other  Morbid obesity  Renal/GU negative Renal ROS     Musculoskeletal   Abdominal (+) + obese,   Peds  Hematology negative hematology ROS (+)   Anesthesia Other Findings   Reproductive/Obstetrics                             Anesthesia Physical Anesthesia Plan  ASA: II  Anesthesia Plan: General   Post-op Pain Management:    Induction: Intravenous  Airway Management Planned: LMA  Additional Equipment:   Intra-op Plan:   Post-operative Plan:   Informed Consent: I have reviewed the patients History and Physical, chart, labs and discussed the procedure including the risks, benefits and alternatives for the proposed anesthesia with the patient or authorized representative who has indicated his/her understanding and acceptance.   Dental advisory given  Plan Discussed with: CRNA and Surgeon  Anesthesia Plan Comments: (Plan routine monitors, GA- LMA )        Anesthesia Quick Evaluation

## 2015-10-28 NOTE — H&P (View-Only) (Signed)
Andrea Gray. Tenny  DOB: 1975/03/20 Married / Language: English / Race: White Female   History of Present Illness  The patient is a 40 year old female.  Note:She was referred by Dr. Evangeline Dakin for consultation regarding an enlarging right breast fibroadenoma. She discovered a mass in her right breast at the 8:30 position on self-exam. Images demonstrated a 1.8 cm solid mass. Biopsy was consistent with a fibroadenoma. She states it has been slowly getting larger. She has a family history of breast cancer in a maternal aunt. She is not having any significant pain or skin dimpling from the mass.  Other Problems  Anxiety Disorder Cholelithiasis Kidney Stone Lump In Breast Umbilical Hernia Repair  Past Surgical History  Breast Biopsy Right. Cesarean Section - 1 Gallbladder Surgery - Laparoscopic Oral Surgery  Diagnostic Studies History  Colonoscopy never Mammogram within last year Pap Smear 1-5 years ago  Allergies Penicillin   Current Outpatient Prescriptions:  .  ibuprofen (ADVIL,MOTRIN) 200 MG tablet, Take 400 mg by mouth every 6 (six) hours as needed for mild pain., Disp: , Rfl:  .  norethindrone (MICRONOR,CAMILA,ERRIN) 0.35 MG tablet, Take 1 tablet by mouth every evening., Disp: , Rfl: 4 .  omeprazole (PRILOSEC) 20 MG capsule, Take 20 mg by mouth daily as needed for heartburn., Disp: , Rfl: 0   Social History Alcohol use Moderate alcohol use. Caffeine use Coffee, Tea. No drug use Tobacco use Never smoker.  Family History  Alcohol Abuse Brother. Anesthetic complications Father. Arthritis Father. Depression Brother, Mother. Diabetes Mellitus Mother. Hypertension Father, Mother. Migraine Headache Mother. Respiratory Condition Mother.  Pregnancy / Birth History  Age at menarche 67 years. Contraceptive History Intrauterine device, Oral contraceptives. Gravida 4 Irregular periods Maternal age 60-30 Para 2   Physical Exam   The physical exam findings are as follows: Note:General: Overweight female in NAD. Pleasant and cooperative.  HEENT: [place]/AT, no facial masses  NECK: Supple, no obvious mass or thyroid enlargement.  CV: RRR, no murmur, no JVD.  CHEST: Breath sounds equal and clear. Respirations nonlabored.  BREASTS: Symmetrical in size. There is a palpable, mobile approximately 2 cm mass 8:30 position right breast. No suspicious skin changes. No masses palpable and left breast.  LYMPHATIC: No palpable cervical, supraclavicular, axillary adenopathy.  SKIN: No jaundice.  NEUROLOGIC: Alert and oriented, answers questions appropriately.  PSYCHIATRIC: Normal mood, affect , and behavior.    Assessment & Plan  FIBROADENOMA OF RIGHT BREAST (D24.1) Impression: This has been getting larger since her diagnosis last year.  Plan: Excision of right breast fibroadenoma. We will try to hide the scar. I have explained the procedure, risks, and aftercare to her. Risks include but are not limited to bleeding, infection, wound problems, cosmetic deformity, anesthesia. She seems to [facility] and agrees with the plan.  Jackolyn Confer, MD

## 2015-10-28 NOTE — Discharge Instructions (Addendum)
[place] Office Phone Number [phone]  BREAST BIOPSY/ PARTIAL MASTECTOMY: POST OP INSTRUCTIONS  Always review your discharge instruction sheet given to you by the facility where your surgery was performed.  IF YOU HAVE DISABILITY OR FAMILY LEAVE FORMS, YOU MUST BRING THEM TO THE OFFICE FOR PROCESSING.  DO NOT GIVE THEM TO YOUR DOCTOR.  1. A prescription for pain medication may be given to you upon discharge.  Take your pain medication as prescribed, if needed.  If narcotic pain medicine is not needed, then you may take acetaminophen (Tylenol) or ibuprofen (Advil) as needed. 2. Take your usually prescribed medications unless otherwise directed 3. If you need a refill on your pain medication, please contact your pharmacy.  They will contact our office to request authorization.  Prescriptions will not be filled after 5pm or on week-ends. 4. You should eat very light the first 24 hours after surgery, such as soup, crackers, pudding, etc.  Resume your normal diet the day after surgery. 5. Most patients will experience some swelling and bruising in the breast.  Ice packs and a good support bra will help.  Swelling and bruising can take several days to resolve.  6. It is common to experience some constipation if taking pain medication after surgery.  Increasing fluid intake and taking a stool softener will usually help or prevent this problem from occurring.  A mild laxative (Milk of Magnesia or Miralax) should be taken according to package directions if there are no bowel movements after 48 hours. 7. Unless discharge instructions indicate otherwise, you may remove your bandages 48 hours after surgery, and you may shower at that time.  You may have steri-strips (small skin tapes) in place directly over the incision.  These strips should be left on the skin.  If your surgeon used skin glue on the incision, you may shower in 24 hours.  The glue will flake off over the next 2-3 weeks.   Any sutures or staples will be removed at the office during your follow-up visit. 8. ACTIVITIES:  You may resume light daily activities (gradually increasing) beginning the next day. Resume all normal activities when pain-free.  Wearing a good support bra or sports bra minimizes pain and swelling.  You may have sexual intercourse when it is comfortable. a. You may drive when you no longer are taking prescription pain medication, you can comfortably wear a seatbelt, and you can safely maneuver your car and apply brakes. b. RETURN TO WORK:  _3-7 days when comfortable._____________________________________________________________________________________ 9. You should see your doctor in the office for a follow-up appointment approximately two weeks after your surgery.  Please call to make that appointment. Expect your pathology report by Tuesday of next week.  You may call to check if you do not hear from [place] by then. 10. OTHER INSTRUCTIONS: _______________________________________________________________________________________________ _____________________________________________________________________________________________________________________________________ _____________________________________________________________________________________________________________________________________ _____________________________________________________________________________________________________________________________________  WHEN TO CALL YOUR DOCTOR: 1. Fever over 101.0 2. Nausea and/or vomiting. 3. Extreme swelling or bruising. 4. Continued bleeding from incision. 5. Increased pain, redness, or drainage from the incision.  The clinic staff is available to answer your questions during regular business hours.  Please dont hesitate to call and ask to speak to one of the nurses for clinical concerns.  If you have a medical emergency, go to the nearest emergency room or call 911.  A surgeon from [facility] is always on call at the hospital.  For further questions, please visit [email or web address]

## 2015-10-28 NOTE — Anesthesia Procedure Notes (Signed)
Procedure Name: LMA Insertion Date/Time: 10/28/2015 12:07 PM Performed by: Annye Asa Pre-anesthesia Checklist: Patient identified, Emergency Drugs available, Suction available and Patient being monitored Patient Re-evaluated:Patient Re-evaluated prior to inductionOxygen Delivery Method: Circle system utilized Preoxygenation: Pre-oxygenation with 100% oxygen Intubation Type: IV induction LMA: LMA inserted LMA Size: 4.0 Number of attempts: 1 Placement Confirmation: positive ETCO2 and breath sounds checked- equal and bilateral Tube secured with: Tape Dental Injury: Teeth and Oropharynx as per pre-operative assessment and Injury to lip

## 2015-10-28 NOTE — Transfer of Care (Signed)
Immediate Anesthesia Transfer of Care Note  Patient: Andrea Gray  Procedure(s) Performed: Procedure(s): EXCISION OF RIGHT BREAST FIBROADENOMA (Right)  Patient Location: PACU  Anesthesia Type:General  Level of Consciousness: awake, alert , oriented and patient cooperative  Airway & Oxygen Therapy: Patient Spontanous Breathing and Patient connected to face mask oxygen  Post-op Assessment: Report given to RN, Post -op Vital signs reviewed and stable, Patient moving all extremities and Patient moving all extremities X 4  Post vital signs: Reviewed and stable  Last Vitals:  Vitals:   10/28/15 0943  BP: (!) 147/83  Pulse: (!) 105  Resp: 20  Temp: 36.7 C    Last Pain:  Vitals:   10/28/15 0943  TempSrc: Oral         Complications: No apparent anesthesia complications

## 2015-10-28 NOTE — Op Note (Signed)
Operative Note  Andrea Gray female 40 y.o. 10/28/2015  PREOPERATIVE DX:  Enlarging right breast fibroadenoma  POSTOPERATIVE DX:  Same  PROCEDURE:   Excision of right breast fibroadenoma         Surgeon: Odis Hollingshead   Assistants: None  Anesthesia: General LMA  Indications:   This is a 40 year old female who has a known fibroadenoma 8:30 position of right breast that has been getting larger. She now presents for removal.    Procedure Detail:  She was seen in the holding area in the right breast marked with my initials. She was brought to the operating room placed supine on the operating table and a general anesthetic was given. Right breast was sterilely prepped and draped and a timeout was performed.  Local anesthetic (1/2% Marcaine) was infiltrated in the right inframammary fold to the right of the midline. An incision was made in the skin and subcutaneous tissue sharply. Using electrocautery I divided some the subcutaneous tissue. I was able to manipulate the fibroadenoma externally and using electrocautery I was able to remove it through this incision along with normal tissue around it. The margins of the specimen were oriented using ink.  The specimen was sent to pathology.  The wound was inspected. Bleeding was controlled with electrocautery. Once hemostasis was adequate, Marcaine was injected into the deep subcutaneous tissue the wound. The subcutaneous tissues and approximated with interrupted 3-0 Vicryl sutures. The skin is closed with a running 4-0 Monocryl subcuticular stitch. Steri-Strips and a sterile dressing were applied. A breast binder was applied.  She tolerated procedure well without any apparent complications. Estimated blood loss was approximately 100-150 cc. She was taken to the recovery room in satisfactory condition.         Specimens: Right breast fibroadenoma        Complications:  * No complications entered in OR log *         Disposition: PACU -  hemodynamically stable.         Condition: stable

## 2015-10-28 NOTE — Interval H&P Note (Signed)
History and Physical Interval Note:  10/28/2015 11:51 AM  Andrea Gray  has presented today for surgery, with the diagnosis of RIGHT BREAST FIBROADENOMA  The various methods of treatment have been discussed with the patient and family. After consideration of risks, benefits and other options for treatment, the patient has consented to  Procedure(s): EXCISION OF RIGHT BREAST FIBROADENOMA (Right) as a surgical intervention .  The patient's history has been reviewed, patient examined, no change in status, stable for surgery.  I have reviewed the patient's chart and labs.  Questions were answered to the patient's satisfaction.     Heidie Krall Lenna Sciara

## 2015-10-29 ENCOUNTER — Encounter (HOSPITAL_COMMUNITY): Payer: Self-pay | Admitting: General Surgery

## 2015-11-01 MED FILL — HEATHER TABLET: 0.35 | 84 days supply | Qty: 84 | Fill #2

## 2016-01-24 MED FILL — HEATHER TABLET: 0.35 | 84 days supply | Qty: 84 | Fill #3

## 2016-03-13 MED FILL — OMEPRAZOLE DR 20 MG CAPSULE: 20 | 70 days supply | Qty: 70 | Fill #1

## 2016-04-17 MED FILL — NORETHINDRONE 0.35 MG TAB: 0.35 | 84 days supply | Qty: 84 | Fill #4

## 2016-06-30 ENCOUNTER — Other Ambulatory Visit: Payer: Self-pay | Admitting: Obstetrics and Gynecology

## 2016-06-30 DIAGNOSIS — Z6830 Body mass index (BMI) 30.0-30.9, adult: Secondary | ICD-10-CM | POA: Diagnosis not present

## 2016-06-30 DIAGNOSIS — Z01419 Encounter for gynecological examination (general) (routine) without abnormal findings: Secondary | ICD-10-CM | POA: Diagnosis not present

## 2016-06-30 DIAGNOSIS — Z124 Encounter for screening for malignant neoplasm of cervix: Secondary | ICD-10-CM | POA: Diagnosis not present

## 2016-06-30 MED FILL — NORETHINDRONE 0.35 MG TAB: 0.35 | 84 days supply | Qty: 84 | Fill #0

## 2016-06-30 MED FILL — OMEPRAZOLE DR 20 MG CAPSULE: 20 | 90 days supply | Qty: 90 | Fill #0

## 2016-07-03 LAB — CYTOLOGY - PAP

## 2016-07-03 LAB — HM PAP SMEAR: HM Pap smear: NEGATIVE

## 2016-07-12 DIAGNOSIS — H524 Presbyopia: Secondary | ICD-10-CM | POA: Diagnosis not present

## 2016-07-12 DIAGNOSIS — H5202 Hypermetropia, left eye: Secondary | ICD-10-CM | POA: Diagnosis not present

## 2016-07-12 DIAGNOSIS — H52223 Regular astigmatism, bilateral: Secondary | ICD-10-CM | POA: Diagnosis not present

## 2016-09-25 MED FILL — OMEPRAZOLE DR 20 MG CAPSULE: 20 | 90 days supply | Qty: 90 | Fill #1

## 2016-09-27 MED FILL — NORETHINDRONE 0.35 MG TAB: 0.35 | 84 days supply | Qty: 84 | Fill #1

## 2016-11-27 ENCOUNTER — Ambulatory Visit (INDEPENDENT_AMBULATORY_CARE_PROVIDER_SITE_OTHER): Payer: 59 | Admitting: Physician Assistant

## 2016-11-27 ENCOUNTER — Encounter: Payer: Self-pay | Admitting: Physician Assistant

## 2016-11-27 VITALS — BP 146/90 | HR 93 | Temp 98.6°F | Ht 64.25 in | Wt 189.0 lb

## 2016-11-27 DIAGNOSIS — I10 Essential (primary) hypertension: Secondary | ICD-10-CM

## 2016-11-27 DIAGNOSIS — Z23 Encounter for immunization: Secondary | ICD-10-CM | POA: Diagnosis not present

## 2016-11-27 MED ORDER — AMLODIPINE BESYLATE 5 MG PO TABS: 5.0000 mg | ORAL_TABLET | Freq: Every day | ORAL | 0 refills | Status: DC

## 2016-11-27 NOTE — Patient Instructions (Signed)
It was great to meet you!  Keep an eye on blood pressure - if consistently >140/90, please let me know.

## 2016-11-27 NOTE — Progress Notes (Signed)
Andrea Gray is a 41 y.o. female here to Establish Care.  I acted as a Education administrator for [employer], PA-C   History of Present Illness:   Chief Complaint  Patient presents with  . Establish Care    [facility] [employer]  . Hypertension    x 1 month    Acute Concerns: Hypertension -- Currently not on any medication. At home blood pressure readings are: average 140 over 80-90's. Patient denies chest pain, SOB, blurred vision, dizziness, lower leg swelling.  Denies excessive caffeine intake, stimulant usage, excessive alcohol intake, or increase in salt consumption. EKG -- last performed in 10/2015. Labwork most recently completed in Sep 2017. Never smoker, 1-2 glasses wine daily. Does note that she has been taking increased amounts of ibuprofen for her headaches recently. Denies concern for sleep apnea.   Has been on oral contraceptives for 4-5 years.  Denies pregnancy.  Health Maintenance: Weight -- Weight: 189 lb (85.7 kg)   Depression screen PHQ 2/9 11/27/2016  Decreased Interest 0  Down, Depressed, Hopeless 0  PHQ - 2 Score 0    No flowsheet data found.   Other providers/specialists: Kristine Linea = Eye Philis Pique = Gyn Ziglar = Dentist  Past Medical History:  Diagnosis Date  . Allergy   . Family history of adverse reaction to anesthesia    Father has PONV  . Fibroadenoma of right breast   . GERD (gastroesophageal reflux disease)   . Headache   . Kidney stones 2007  . PONV (postoperative nausea and vomiting)   . PVC's (premature ventricular contractions)    Hx: of  . Umbilical hernia      Social History   Social History  . Marital status: Married    Spouse name: N/A  . Number of children: N/A  . Years of education: N/A   Occupational History  . Not on file.   Social History Main Topics  . Smoking status: Never Smoker  . Smokeless tobacco: Never Used  . Alcohol use Yes     Comment: daily wine 1-2 glasses  . Drug use: No  . Sexual activity:  Yes    Birth control/ protection: Pill   Other Topics Concern  . Not on file   Social History Narrative   She is a massage therapist   Husband is pharmacist   Has twin boys       Past Surgical History:  Procedure Laterality Date  . BREAST SURGERY  2017   removal of Fibroadenoma  . CESAREAN SECTION    . CHOLECYSTECTOMY    . CYST EXCISION     from lip as a toddler  . DILATION AND CURETTAGE OF UTERUS    . LASIK    . LITHOTRIPSY    . MASS EXCISION Right 10/28/2015   Procedure: EXCISION OF RIGHT BREAST FIBROADENOMA;  Surgeon: Jackolyn Confer, MD;  Location: [facility];  Service: General;  Laterality: Right;  . WISDOM TOOTH EXTRACTION      Family History  Problem Relation Age of Onset  . Hypertension Mother   . COPD Mother   . Depression Mother   . Diabetes Mother   . Hypertension Father   . Alcohol abuse Brother   . COPD Brother   . Depression Brother   . Drug abuse Brother   . Hypertension Brother   . Learning disabilities Brother   . Mental illness Brother   . Arthritis Maternal Grandmother   . Asthma Maternal Grandmother   . Hypertension Maternal  Grandmother   . Alcohol abuse Maternal Grandfather   . Hypertension Maternal Grandfather   . Arthritis Paternal Grandmother   . Cancer Paternal Grandmother        unknown type  . Early death Paternal Grandmother   . Hearing loss Paternal Grandfather     Allergies  Allergen Reactions  . Penicillins     Has patient had a PCN reaction causing immediate rash, facial/tongue/throat swelling, SOB or lightheadedness with hypotension: Yes Has patient had a PCN reaction causing severe rash involving mucus membranes or skin necrosis: No Has patient had a PCN reaction that required hospitalization Yes Has patient had a PCN reaction occurring within the last 10 years: No If all of the above answers are "NO", then may proceed with Cephalosporin use.       Current Medications:   Current Outpatient Prescriptions:  .  ibuprofen  (ADVIL,MOTRIN) 200 MG tablet, Take 400 mg by mouth every 6 (six) hours as needed for mild pain., Disp: , Rfl:  .  norethindrone (MICRONOR,CAMILA,ERRIN) 0.35 MG tablet, Take 1 tablet by mouth every evening., Disp: , Rfl: 4 .  omeprazole (PRILOSEC) 20 MG capsule, Take 20 mg by mouth daily as needed for heartburn., Disp: , Rfl: 0 .  amLODipine (NORVASC) 5 MG tablet, Take 1 tablet (5 mg total) by mouth daily., Disp: 60 tablet, Rfl: 0   Review of Systems:   ROS  Negative unless otherwise specified in HPI.  Vitals:   Vitals:   11/27/16 1508  BP: (!) 160/100  Pulse: 93  Temp: 98.6 F (37 C)  TempSrc: Oral  SpO2: 98%  Weight: 189 lb (85.7 kg)  Height: 5' 4.25" (1.632 m)     Body mass index is 32.19 kg/m.  Physical Exam:   Physical Exam  Constitutional: She appears well-developed. She is cooperative.  Non-toxic appearance. She does not have a sickly appearance. She does not appear ill. No distress.  Cardiovascular: Normal rate, regular rhythm, S1 normal, S2 normal, normal heart sounds and normal pulses.   No LE edema  Pulmonary/Chest: Effort normal and breath sounds normal.  Neurological: She is alert. GCS eye subscore is 4. GCS verbal subscore is 5. GCS motor subscore is 6.  Skin: Skin is warm, dry and intact.  Psychiatric: She has a normal mood and affect. Her speech is normal and behavior is normal.  Nursing note and vitals reviewed.   Assessment and Plan:    [place] was seen today for establish care and hypertension.  Diagnoses and all orders for this visit:  Hypertension, unspecified type Blood pressure log and in-office blood pressure consistent with HTN, new diagnosis. Will check baseline labs today. Start 5 mg Norvasc and follow-up in 4 weeks. If worsening symptoms, patient needs to be seen sooner. Discussed reduction of ibuprofen. Needs fasting lipid panel at follow-up. -     CBC with Differential/Platelet -     Basic metabolic panel -     TSH -     Microalbumin /  creatinine urine ratio  Need for prophylactic vaccination and inoculation against influenza Flu vaccine given.  Other orders -     amLODipine (NORVASC) 5 MG tablet; Take 1 tablet (5 mg total) by mouth daily.    . Reviewed expectations re: course of current medical issues. . Discussed self-management of symptoms. . Outlined signs and symptoms indicating need for more acute intervention. . Patient verbalized understanding and all questions were answered. . See orders for this visit as documented in the electronic medical  record. . Patient received an After-Visit Summary.   CMA or LPN served as scribe during this visit. History, Physical, and Plan performed by medical provider. Documentation and orders reviewed and attested to.   Inda Coke, PA-C

## 2016-11-28 LAB — CBC WITH DIFFERENTIAL/PLATELET
BASOS PCT: 0.4 % (ref 0.0–3.0)
Basophils Absolute: 0 10*3/uL (ref 0.0–0.1)
Eosinophils Absolute: 0.2 10*3/uL (ref 0.0–0.7)
Eosinophils Relative: 3.1 % (ref 0.0–5.0)
HCT: 40.1 % (ref 36.0–46.0)
HEMOGLOBIN: 13.2 g/dL (ref 12.0–15.0)
Lymphocytes Relative: 27.4 % (ref 12.0–46.0)
Lymphs Abs: 2.1 10*3/uL (ref 0.7–4.0)
MCHC: 32.8 g/dL (ref 30.0–36.0)
MCV: 98 fl (ref 78.0–100.0)
MONO ABS: 0.6 10*3/uL (ref 0.1–1.0)
MONOS PCT: 7.7 % (ref 3.0–12.0)
Neutro Abs: 4.7 10*3/uL (ref 1.4–7.7)
Neutrophils Relative %: 61.4 % (ref 43.0–77.0)
Platelets: 255 10*3/uL (ref 150.0–400.0)
RBC: 4.09 Mil/uL (ref 3.87–5.11)
RDW: 12.1 % (ref 11.5–15.5)
WBC: 7.6 10*3/uL (ref 4.0–10.5)

## 2016-11-28 LAB — BASIC METABOLIC PANEL
BUN: 16 mg/dL (ref 6–23)
CALCIUM: 9.5 mg/dL (ref 8.4–10.5)
CO2: 25 meq/L (ref 19–32)
Chloride: 103 mEq/L (ref 96–112)
Creatinine, Ser: 0.84 mg/dL (ref 0.40–1.20)
GFR: 78.63 mL/min (ref >60.00)
GLUCOSE: 96 mg/dL (ref 70–99)
Potassium: 3.8 mEq/L (ref 3.5–5.1)
SODIUM: 139 meq/L (ref 135–145)

## 2016-11-28 LAB — TSH: TSH: 3.23 u[IU]/mL (ref 0.35–4.50)

## 2016-11-29 LAB — MICROALBUMIN / CREATININE URINE RATIO
Creatinine,U: 136.6 mg/dL
Microalb Creat Ratio: 1.5 mg/g (ref 0.0–30.0)
Microalb, Ur: 2 mg/dL — ABNORMAL HIGH (ref 0.0–1.9)

## 2016-12-01 ENCOUNTER — Encounter: Payer: Self-pay | Admitting: Physician Assistant

## 2016-12-08 ENCOUNTER — Encounter: Payer: Self-pay | Admitting: Physician Assistant

## 2016-12-20 ENCOUNTER — Telehealth: Payer: Self-pay

## 2016-12-20 NOTE — Telephone Encounter (Signed)
Faxed ROI to [place] @ [facility]

## 2016-12-22 MED FILL — OMEPRAZOLE 20 MG CAP: 20 | 90 days supply | Qty: 90 | Fill #2

## 2016-12-22 MED FILL — AMLODIPINE BESYLATE 5 MG TA: 5 | 30 days supply | Qty: 30 | Fill #0

## 2016-12-22 MED FILL — NORETHINDRONE 0.35 MG TAB: 0.35 | 84 days supply | Qty: 84 | Fill #2

## 2016-12-25 ENCOUNTER — Encounter: Payer: Self-pay | Admitting: Physician Assistant

## 2016-12-25 ENCOUNTER — Ambulatory Visit (INDEPENDENT_AMBULATORY_CARE_PROVIDER_SITE_OTHER): Payer: 59 | Admitting: Physician Assistant

## 2016-12-25 DIAGNOSIS — I10 Essential (primary) hypertension: Secondary | ICD-10-CM

## 2016-12-25 MED ORDER — AMLODIPINE BESYLATE 5 MG PO TABS: 5.0000 mg | ORAL_TABLET | Freq: Every day | ORAL | 1 refills | Status: DC

## 2016-12-25 NOTE — Assessment & Plan Note (Addendum)
Well-controlled presently on 5 mg Norvasc. Continue this medication, follow-up in 6 months, sooner if needed. Discussed that she continue to keep an eye on blood pressure - if consistently >140/90, please let us know.

## 2016-12-25 NOTE — Progress Notes (Signed)
Andrea Gray is a 41 y.o. female is here to discuss: Hypertension  I acted as a Education administrator for [employer], PA-C Anselmo Pickler, LPN  History of Present Illness:   Chief Complaint  Patient presents with  . Follow-up  . Hypertension    Hypertension  This is a chronic (Pt has been checking blood pressure at home and Systolic has been averaging 128-786, Dystolic 76-72.) problem. Episode onset: Dx one month ago. The problem has been gradually improving since onset. The problem is controlled. Associated symptoms include palpitations. Pertinent negatives include no blurred vision, chest pain, headaches, malaise/fatigue, neck pain, peripheral edema or shortness of breath. Risk factors for coronary artery disease include family history. The current treatment provides moderate improvement. There are no compliance problems.    She is pleased with the medication. Her in-laws are in town for the holidays and she is anticipating making some poor dietary choices soon.   Health Maintenance Due  Topic Date Due  . HIV Screening  11/03/1988  . MAMMOGRAM  07/05/2016    Past Medical History:  Diagnosis Date  . Allergy   . Family history of adverse reaction to anesthesia    Father has PONV  . Fibroadenoma of right breast   . GERD (gastroesophageal reflux disease)   . Headache   . Kidney stones 2007  . PONV (postoperative nausea and vomiting)   . PVC's (premature ventricular contractions)    Hx: of  . Umbilical hernia      Social History   Socioeconomic History  . Marital status: Married    Spouse name: Not on file  . Number of children: Not on file  . Years of education: Not on file  . Highest education level: Not on file  Social Needs  . Financial resource strain: Not on file  . Food insecurity - worry: Not on file  . Food insecurity - inability: Not on file  . Transportation needs - medical: Not on file  . Transportation needs - non-medical: Not on file  Occupational History    . Not on file  Tobacco Use  . Smoking status: Never Smoker  . Smokeless tobacco: Never Used  Substance and Sexual Activity  . Alcohol use: Yes    Comment: daily wine 1-2 glasses  . Drug use: No  . Sexual activity: Yes    Birth control/protection: Pill  Other Topics Concern  . Not on file  Social History Narrative   She is a massage therapist   Husband is pharmacist   Has twin boys    Past Surgical History:  Procedure Laterality Date  . BREAST SURGERY  2017   removal of Fibroadenoma  . CESAREAN SECTION    . CHOLECYSTECTOMY    . CYST EXCISION     from lip as a toddler  . DILATION AND CURETTAGE OF UTERUS    . LASIK    . LITHOTRIPSY    . WISDOM TOOTH EXTRACTION      Family History  Problem Relation Age of Onset  . Hypertension Mother   . COPD Mother   . Depression Mother   . Diabetes Mother   . Hypertension Father   . Alcohol abuse Brother   . COPD Brother   . Depression Brother   . Drug abuse Brother   . Hypertension Brother   . Learning disabilities Brother   . Mental illness Brother   . Arthritis Maternal Grandmother   . Asthma Maternal Grandmother   . Hypertension Maternal Grandmother   .  Alcohol abuse Maternal Grandfather   . Hypertension Maternal Grandfather   . Arthritis Paternal Grandmother   . Cancer Paternal Grandmother        unknown type  . Early death Paternal Grandmother   . Hearing loss Paternal Grandfather   . Breast cancer Other     PMHx, SurgHx, SocialHx, FamHx, Medications, and Allergies were reviewed in the Visit Navigator and updated as appropriate.   Patient Active Problem List   Diagnosis Date Noted  . Hypertension 11/27/2016  . SINUSITIS 07/11/2008  . HEADACHE 07/11/2008  . PALPITATIONS 07/11/2008    Social History   Tobacco Use  . Smoking status: Never Smoker  . Smokeless tobacco: Never Used  Substance Use Topics  . Alcohol use: Yes    Comment: daily wine 1-2 glasses  . Drug use: No    Current Medications and  Allergies:    Current Outpatient Medications:  .  amLODipine (NORVASC) 5 MG tablet, Take 1 tablet (5 mg total) daily by mouth., Disp: 90 tablet, Rfl: 1 .  ibuprofen (ADVIL,MOTRIN) 200 MG tablet, Take 400 mg by mouth every 6 (six) hours as needed for mild pain., Disp: , Rfl:  .  norethindrone (MICRONOR,CAMILA,ERRIN) 0.35 MG tablet, Take 1 tablet by mouth every evening., Disp: , Rfl: 4 .  omeprazole (PRILOSEC) 20 MG capsule, Take 20 mg by mouth daily as needed for heartburn., Disp: , Rfl: 0   Allergies  Allergen Reactions  . Penicillins     Has patient had a PCN reaction causing immediate rash, facial/tongue/throat swelling, SOB or lightheadedness with hypotension: Yes Has patient had a PCN reaction causing severe rash involving mucus membranes or skin necrosis: No Has patient had a PCN reaction that required hospitalization Yes Has patient had a PCN reaction occurring within the last 10 years: No If all of the above answers are "NO", then may proceed with Cephalosporin use.      Review of Systems   Review of Systems  Constitutional: Negative for malaise/fatigue.  Eyes: Negative for blurred vision.  Respiratory: Negative for shortness of breath.   Cardiovascular: Positive for palpitations. Negative for chest pain.  Musculoskeletal: Negative for neck pain.  Neurological: Negative for headaches.    Vitals:   Vitals:   12/25/16 0823  BP: 130/84  Pulse: 96  Temp: 98.3 F (36.8 C)  TempSrc: Oral  SpO2: 99%  Weight: 187 lb 8 oz (85 kg)  Height: 5' 4.25" (1.632 m)     Body mass index is 31.93 kg/m.   Physical Exam:    Physical Exam  Constitutional: She appears well-developed. She is cooperative.  Non-toxic appearance. She does not have a sickly appearance. She does not appear ill. No distress.  Cardiovascular: Normal rate, regular rhythm, S1 normal, S2 normal, normal heart sounds and normal pulses.  No LE edema  Pulmonary/Chest: Effort normal and breath sounds normal.    Neurological: She is alert. GCS eye subscore is 4. GCS verbal subscore is 5. GCS motor subscore is 6.  Skin: Skin is warm, dry and intact.  Psychiatric: She has a normal mood and affect. Her speech is normal and behavior is normal.  Nursing note and vitals reviewed.      Assessment and Plan:    Problem List Items Addressed This Visit      Cardiovascular and Mediastinum   Hypertension (Chronic)    Well-controlled presently on 5 mg Norvasc. Continue this medication, follow-up in 6 months, sooner if needed. Discussed that she continue to keep an eye  on blood pressure - if consistently >140/90, please let us know.       Relevant Medications   amLODipine (NORVASC) 5 MG tablet       . Reviewed expectations re: course of current medical issues. . Discussed self-management of symptoms. . Outlined signs and symptoms indicating need for more acute intervention. . Patient verbalized understanding and all questions were answered. . See orders for this visit as documented in the electronic medical record. . Patient received an After Visit Summary.  CMA or LPN served as scribe during this visit. History, Physical, and Plan performed by medical provider. Documentation and orders reviewed and attested to.  Inda Coke, PA-C [employer] [place] 12/25/2016  Follow-up: No Follow-up on file.

## 2017-01-25 MED FILL — AMLODIPINE BESYLATE 5 MG TA: 5 | 90 days supply | Qty: 90 | Fill #0

## 2017-03-05 MED FILL — NORETHINDRONE 0.35 MG TAB: 0.35 | 84 days supply | Qty: 84 | Fill #3

## 2017-03-05 MED FILL — OMEPRAZOLE 20 MG CAP: 20 | 90 days supply | Qty: 90 | Fill #3

## 2017-03-12 ENCOUNTER — Telehealth: Payer: 59 | Admitting: Nurse Practitioner

## 2017-03-12 DIAGNOSIS — J0100 Acute maxillary sinusitis, unspecified: Secondary | ICD-10-CM

## 2017-03-12 MED ORDER — DOXYCYCLINE HYCLATE 100 MG PO TABS: 100.0000 mg | ORAL_TABLET | Freq: Two times a day (BID) | ORAL | 0 refills | Status: DC

## 2017-03-12 NOTE — Progress Notes (Signed)

## 2017-04-25 ENCOUNTER — Telehealth: Payer: 59 | Admitting: Nurse Practitioner

## 2017-04-25 DIAGNOSIS — J0100 Acute maxillary sinusitis, unspecified: Secondary | ICD-10-CM | POA: Diagnosis not present

## 2017-04-25 MED ORDER — DOXYCYCLINE HYCLATE 100 MG PO TABS: 100.0000 mg | ORAL_TABLET | Freq: Two times a day (BID) | ORAL | 0 refills | Status: DC

## 2017-04-25 NOTE — Progress Notes (Signed)

## 2017-05-07 MED FILL — AMLODIPINE BESYLATE 5 MG TA: 5 | 90 days supply | Qty: 90 | Fill #1

## 2017-05-15 ENCOUNTER — Ambulatory Visit (HOSPITAL_COMMUNITY)
Admission: RE | Admit: 2017-05-15 | Discharge: 2017-05-15 | Disposition: A | Discharge status: Home / Self Care | Payer: 59 | Source: Ambulatory Visit | Attending: Cardiology | Admitting: Cardiology

## 2017-05-15 ENCOUNTER — Encounter: Payer: Self-pay | Admitting: Physician Assistant

## 2017-05-15 ENCOUNTER — Ambulatory Visit (INDEPENDENT_AMBULATORY_CARE_PROVIDER_SITE_OTHER): Payer: 59 | Admitting: Physician Assistant

## 2017-05-15 VITALS — BP 120/80 | HR 94 | Temp 98.3°F | Ht 64.25 in | Wt 185.0 lb

## 2017-05-15 DIAGNOSIS — M79662 Pain in left lower leg: Secondary | ICD-10-CM | POA: Insufficient documentation

## 2017-05-15 DIAGNOSIS — M67471 Ganglion, right ankle and foot: Secondary | ICD-10-CM | POA: Diagnosis not present

## 2017-05-15 NOTE — Progress Notes (Signed)
Andrea Gray is a 42 y.o. female here for a new problem.  I acted as a Education administrator for [employer], PA-C Anselmo Pickler, LPN  History of Present Illness:   Chief Complaint  Patient presents with  . Edema    Left calf  . Cyst    top of right foot    Leg Pain   Incident onset: Started a week ago. The incident occurred at home. There was no injury mechanism. Pain location: Left calf. The quality of the pain is described as aching. The pain is at a severity of 3/10. The pain is mild. The pain has been intermittent since onset. Associated symptoms include tingling. Associated symptoms comments: Edema in left calf. She reports no foreign bodies present. The symptoms are aggravated by weight bearing. She has tried NSAIDs and rest for the symptoms. The treatment provided mild relief.  Other  This is a new (Pt noticied cyst on top of right foot) problem. Episode onset: started one month ago. Progression since onset: has grown in size. Associated symptoms comments: Solid cyst top of right foot no pain. Nothing aggravates the symptoms. She has tried nothing for the symptoms. The treatment provided no relief.   Denies SOB, maternal aunt had blood clot in her calf recently, no recent travel, no recent immobility, no recent surgery, no CP, denies other family hx of strokes. Has been on birth control pills for several years. She is not a smoker.   Past Medical History:  Diagnosis Date  . Allergy   . Family history of adverse reaction to anesthesia    Father has PONV  . Fibroadenoma of right breast   . GERD (gastroesophageal reflux disease)   . Headache   . Kidney stones 2007  . PONV (postoperative nausea and vomiting)   . PVC's (premature ventricular contractions)    Hx: of  . Umbilical hernia      Social History   Socioeconomic History  . Marital status: Married    Spouse name: Not on file  . Number of children: Not on file  . Years of education: Not on file  . Highest education level:  Not on file  Occupational History  . Not on file  Social Needs  . Financial resource strain: Not on file  . Food insecurity:    Worry: Not on file    Inability: Not on file  . Transportation needs:    Medical: Not on file    Non-medical: Not on file  Tobacco Use  . Smoking status: Never Smoker  . Smokeless tobacco: Never Used  Substance and Sexual Activity  . Alcohol use: Yes    Comment: daily wine 1-2 glasses  . Drug use: No  . Sexual activity: Yes    Birth control/protection: Pill  Lifestyle  . Physical activity:    Days per week: Not on file    Minutes per session: Not on file  . Stress: Not on file  Relationships  . Social connections:    Talks on phone: Not on file    Gets together: Not on file    Attends religious service: Not on file    Active member of club or organization: Not on file    Attends meetings of clubs or organizations: Not on file    Relationship status: Not on file  . Intimate partner violence:    Fear of current or ex partner: Not on file    Emotionally abused: Not on file    Physically abused:  Not on file    Forced sexual activity: Not on file  Other Topics Concern  . Not on file  Social History Narrative   She is a massage therapist   Husband is pharmacist   Has twin boys    Past Surgical History:  Procedure Laterality Date  . BREAST SURGERY  2017   removal of Fibroadenoma  . CESAREAN SECTION    . CHOLECYSTECTOMY    . CYST EXCISION     from lip as a toddler  . DILATION AND CURETTAGE OF UTERUS    . LASIK    . LITHOTRIPSY    . MASS EXCISION Right 10/28/2015   Procedure: EXCISION OF RIGHT BREAST FIBROADENOMA;  Surgeon: Jackolyn Confer, MD;  Location: [facility];  Service: General;  Laterality: Right;  . WISDOM TOOTH EXTRACTION      Family History  Problem Relation Age of Onset  . Hypertension Mother   . COPD Mother   . Depression Mother   . Diabetes Mother   . Hypertension Father   . Alcohol abuse Brother   . COPD Brother   .  Depression Brother   . Drug abuse Brother   . Hypertension Brother   . Learning disabilities Brother   . Mental illness Brother   . Arthritis Maternal Grandmother   . Asthma Maternal Grandmother   . Hypertension Maternal Grandmother   . Alcohol abuse Maternal Grandfather   . Hypertension Maternal Grandfather   . Arthritis Paternal Grandmother   . Cancer Paternal Grandmother        unknown type  . Early death Paternal Grandmother   . Hearing loss Paternal Grandfather   . Breast cancer Other     Allergies  Allergen Reactions  . Penicillins     Has patient had a PCN reaction causing immediate rash, facial/tongue/throat swelling, SOB or lightheadedness with hypotension: Yes Has patient had a PCN reaction causing severe rash involving mucus membranes or skin necrosis: No Has patient had a PCN reaction that required hospitalization Yes Has patient had a PCN reaction occurring within the last 10 years: No If all of the above answers are "NO", then may proceed with Cephalosporin use.      Current Medications:   Current Outpatient Medications:  .  amLODipine (NORVASC) 5 MG tablet, Take 1 tablet (5 mg total) daily by mouth., Disp: 90 tablet, Rfl: 1 .  ibuprofen (ADVIL,MOTRIN) 200 MG tablet, Take 400 mg by mouth every 6 (six) hours as needed for mild pain., Disp: , Rfl:  .  norethindrone (MICRONOR,CAMILA,ERRIN) 0.35 MG tablet, Take 1 tablet by mouth every evening., Disp: , Rfl: 4 .  omeprazole (PRILOSEC) 20 MG capsule, Take 20 mg by mouth daily as needed for heartburn., Disp: , Rfl: 0   Review of Systems:   Review of Systems  Neurological: Positive for tingling.  Negative unless otherwise specified per HPI.  Vitals:   Vitals:   05/15/17 0858  BP: 120/80  Pulse: 94  Temp: 98.3 F (36.8 C)  TempSrc: Oral  SpO2: 98%  Weight: 185 lb (83.9 kg)  Height: 5' 4.25" (1.632 m)     Body mass index is 31.51 kg/m.  Physical Exam:   Physical Exam  Constitutional: She appears  well-developed. She is cooperative.  Non-toxic appearance. She does not have a sickly appearance. She does not appear ill. No distress.  Cardiovascular: Normal rate, regular rhythm, S1 normal, S2 normal, normal heart sounds and normal pulses.  Pulses:      Dorsalis pedis  pulses are 2+ on the right side, and 2+ on the left side.       Posterior tibial pulses are 2+ on the right side, and 2+ on the left side.  No LE edema  Pulmonary/Chest: Effort normal and breath sounds normal.  Musculoskeletal:  L calf: 43 cm R calf: 44 cm  Negative Homan's sign bilaterally.  Slight tenderness with deep palpation to popliteal fossa in L calf. No warmth, redness, noted.  Small mobile mass on dorsum of R foot, freely mobile, approximately 1 cm. Non-tender with palpation.  Neurological: She is alert. GCS eye subscore is 4. GCS verbal subscore is 5. GCS motor subscore is 6.  Skin: Skin is warm, dry and intact.  Psychiatric: She has a normal mood and affect. Her speech is normal and behavior is normal.  Nursing note and vitals reviewed.   Assessment and Plan:    Aggie was seen today for edema and cyst.  Diagnoses and all orders for this visit:  Pain of left calf Wells Criteria for DVT is 1. Discussed plan of care with Dr. Briscoe Deutscher, will order stat U/S. Risk factors include current oral contraceptive use. Ultrasound scheduled for noon today. Advised patient as follows, "If between now and when you have your ultrasound performed you develop any chest pain, shortness of breath, worsening pain/swelling --> please go to the emergency room." -     US Venous Img Lower Bilateral; Future  Ganglion cyst of right foot Discussed plan of care with patient. Recommend supportive shoes. May consider compression. If area becomes bothersome, I recommend that she make an appointment with Dr. Paulla Fore so he could evaluate it and possibly inject it. Discussed that if she would like it permanently removed, would need  surgical evaluation. Patient opted for conservative treatment at this time and will let us know if she would like to see Dr. Paulla Fore.  . Reviewed expectations re: course of current medical issues. . Discussed self-management of symptoms. . Outlined signs and symptoms indicating need for more acute intervention. . Patient verbalized understanding and all questions were answered. . See orders for this visit as documented in the electronic medical record. . Patient received an After-Visit Summary.  CMA or LPN served as scribe during this visit. History, Physical, and Plan performed by medical provider. Documentation and orders reviewed and attested to.  Inda Coke, PA-C

## 2017-05-15 NOTE — Patient Instructions (Addendum)
It was great to see you!  Your ultrasound is scheduled for: noon today.  If between now and when you have your ultrasound performed you develop any chest pain, shortness of breath, worsening pain/swelling --> please go to the emergency room.  If you would like Dr. Paulla Fore to evaluate your cyst on your foot, please call our office and make an appointment with him.

## 2017-05-20 ENCOUNTER — Encounter (HOSPITAL_COMMUNITY): Payer: Self-pay

## 2017-05-20 ENCOUNTER — Encounter: Payer: Self-pay | Admitting: Family

## 2017-05-20 ENCOUNTER — Ambulatory Visit: Payer: Self-pay | Admitting: Family

## 2017-05-20 ENCOUNTER — Other Ambulatory Visit: Payer: Self-pay

## 2017-05-20 ENCOUNTER — Emergency Department (HOSPITAL_COMMUNITY)
Admission: EM | Admit: 2017-05-20 | Discharge: 2017-05-21 | Disposition: A | Discharge status: Home / Self Care | Payer: 59 | Attending: Emergency Medicine | Admitting: Emergency Medicine

## 2017-05-20 ENCOUNTER — Emergency Department (HOSPITAL_COMMUNITY): Payer: 59

## 2017-05-20 VITALS — BP 120/82 | HR 94 | Temp 98.6°F | Wt 187.0 lb

## 2017-05-20 DIAGNOSIS — W1842XA Slipping, tripping and stumbling without falling due to stepping into hole or opening, initial encounter: Secondary | ICD-10-CM | POA: Insufficient documentation

## 2017-05-20 DIAGNOSIS — Z79899 Other long term (current) drug therapy: Secondary | ICD-10-CM | POA: Diagnosis not present

## 2017-05-20 DIAGNOSIS — S99921A Unspecified injury of right foot, initial encounter: Secondary | ICD-10-CM | POA: Diagnosis present

## 2017-05-20 DIAGNOSIS — M25571 Pain in right ankle and joints of right foot: Secondary | ICD-10-CM

## 2017-05-20 DIAGNOSIS — I10 Essential (primary) hypertension: Secondary | ICD-10-CM | POA: Diagnosis not present

## 2017-05-20 DIAGNOSIS — Y9301 Activity, walking, marching and hiking: Secondary | ICD-10-CM | POA: Diagnosis not present

## 2017-05-20 DIAGNOSIS — S93601A Unspecified sprain of right foot, initial encounter: Secondary | ICD-10-CM | POA: Diagnosis not present

## 2017-05-20 DIAGNOSIS — Y999 Unspecified external cause status: Secondary | ICD-10-CM | POA: Diagnosis not present

## 2017-05-20 DIAGNOSIS — M7989 Other specified soft tissue disorders: Secondary | ICD-10-CM | POA: Diagnosis not present

## 2017-05-20 DIAGNOSIS — Y929 Unspecified place or not applicable: Secondary | ICD-10-CM | POA: Diagnosis not present

## 2017-05-20 NOTE — ED Triage Notes (Signed)
Patient c/o right foot pain after stepping in a pothole 3 nights ago. Right foot swelling.

## 2017-05-20 NOTE — Progress Notes (Signed)
Patient needs an xray. Sent to [facility]

## 2017-05-21 NOTE — Discharge Instructions (Addendum)
Rest, Ice intermittently (in the first 24-48 hours), Gentle compression with an Ace wrap, and elevate (Limb above the level of the heart)   Take up to 800mg of ibuprofen (that is usually 4 over the counter pills)  3 times a day for 5 days. Take with food.  

## 2017-05-21 NOTE — ED Provider Notes (Signed)
[facility] EMERGENCY DEPARTMENT Provider Note   CSN: [id number] Arrival date & time: 05/20/17  2118     History   Chief Complaint Chief Complaint  Patient presents with  . Fall    HPI   Blood pressure 140/82, pulse (!) 101, temperature 98.1 F (36.7 C), resp. rate 18, height 5\' 4"  (1.626 m), weight 83.9 kg (185 lb), SpO2 99 %.  Andrea Gray is a 42 y.o. female complaining of severe right foot pain and swelling after she stepped into a hole several days ago.  She is been ambulatory but with pain.  She has been using an ankle splint at home with little relief.  No prior trauma or surgeries to the affected joint.  She is been taking ibuprofen 400 mg every 4-6 hours as needed.  She is concerned that she might have a compartment syndrome because many family members to work in the emergency department had advised her that this was a possibility.  She denies any numbness, weakness, significant pain with passive motion.  Past Medical History:  Diagnosis Date  . Allergy   . Family history of adverse reaction to anesthesia    Father has PONV  . Fibroadenoma of right breast   . GERD (gastroesophageal reflux disease)   . Headache   . Kidney stones 2007  . PONV (postoperative nausea and vomiting)   . PVC's (premature ventricular contractions)    Hx: of  . Umbilical hernia     Patient Active Problem List   Diagnosis Date Noted  . Fibroadenoma 12/25/2016  . Hypertension 11/27/2016  . SINUSITIS 07/11/2008  . HEADACHE 07/11/2008  . PALPITATIONS 07/11/2008    Past Surgical History:  Procedure Laterality Date  . BREAST SURGERY  2017   removal of Fibroadenoma  . CESAREAN SECTION    . CHOLECYSTECTOMY    . CYST EXCISION     from lip as a toddler  . DILATION AND CURETTAGE OF UTERUS    . LASIK    . LITHOTRIPSY    . MASS EXCISION Right 10/28/2015   Procedure: EXCISION OF RIGHT BREAST FIBROADENOMA;  Surgeon: Jackolyn Confer, MD;  Location: [facility];  Service: General;   Laterality: Right;  . WISDOM TOOTH EXTRACTION       OB History   None      Home Medications    Prior to Admission medications   Medication Sig Start Date End Date Taking? Authorizing Provider  amLODipine (NORVASC) 5 MG tablet Take 1 tablet (5 mg total) daily by mouth. 12/25/16   Inda Coke, PA  ibuprofen (ADVIL,MOTRIN) 200 MG tablet Take 400 mg by mouth every 6 (six) hours as needed for mild pain.    [provider]  norethindrone (MICRONOR,CAMILA,ERRIN) 0.35 MG tablet Take 1 tablet by mouth every evening. 08/09/15   [provider]  omeprazole (PRILOSEC) 20 MG capsule Take 20 mg by mouth daily as needed for heartburn. 09/06/15   [provider]    Family History Family History  Problem Relation Age of Onset  . Hypertension Mother   . COPD Mother   . Depression Mother   . Diabetes Mother   . Hypertension Father   . Alcohol abuse Brother   . COPD Brother   . Depression Brother   . Drug abuse Brother   . Hypertension Brother   . Learning disabilities Brother   . Mental illness Brother   . Arthritis Maternal Grandmother   . Asthma Maternal Grandmother   . Hypertension  Maternal Grandmother   . Alcohol abuse Maternal Grandfather   . Hypertension Maternal Grandfather   . Arthritis Paternal Grandmother   . Cancer Paternal Grandmother        unknown type  . Early death Paternal Grandmother   . Hearing loss Paternal Grandfather   . Breast cancer Other     Social History Social History   Tobacco Use  . Smoking status: Never Smoker  . Smokeless tobacco: Never Used  Substance Use Topics  . Alcohol use: Yes    Comment: daily wine 1-2 glasses  . Drug use: No     Allergies   Penicillins   Review of Systems Review of Systems  A complete review of systems was obtained and all systems are negative except as noted in the HPI and PMH.   Physical Exam Updated Vital Signs BP 140/82   Pulse (!) 101   Temp 98.1 F (36.7 C)   Resp 18    Ht 5\' 4"  (1.626 m)   Wt 83.9 kg (185 lb)   LMP  (LMP Unknown) Comment: Patient states no period since May 2018  SpO2 99%   BMI 31.76 kg/m   Physical Exam  Constitutional: She is oriented to person, place, and time. She appears well-developed and well-nourished. No distress.  HENT:  Head: Normocephalic and atraumatic.  Mouth/Throat: Oropharynx is clear and moist.  Eyes: Pupils are equal, round, and reactive to light. Conjunctivae and EOM are normal.  Neck: Normal range of motion.  Cardiovascular: Normal rate, regular rhythm and intact distal pulses.  Pulmonary/Chest: Effort normal and breath sounds normal.  Abdominal: Soft. There is no tenderness.  Musculoskeletal: Normal range of motion. She exhibits edema and tenderness.       Feet:  Tender with edema as diagrammed, distally neurovascular intact, no significant pain out of proportion to exam  Neurological: She is alert and oriented to person, place, and time.  Skin: She is not diaphoretic.  Psychiatric: She has a normal mood and affect.  Nursing note and vitals reviewed.    ED Treatments / Results  Labs (all labs ordered are listed, but only abnormal results are displayed) Labs Reviewed - No data to display  EKG None  Radiology Dg Foot Complete Right  Result Date: 05/20/2017 CLINICAL DATA:  Stepped in a hole 3 days ago. Right foot pain and swelling. EXAM: RIGHT FOOT COMPLETE - 3+ VIEW COMPARISON:  None. FINDINGS: Mild hallux valgus deformity with degenerative changes in the first metatarsal-phalangeal joint. No evidence of acute fracture or dislocation. No focal bone lesion or bone destruction. Prominent soft tissue swelling over the lateral aspect of the mid and hindfoot as well as the dorsal aspect of the midfoot. No radiopaque soft tissue foreign bodies. IMPRESSION: 1. No acute fracture or dislocation. 2. Dorsal and lateral soft tissue swelling. 3. Hallux valgus deformity with degenerative changes in the first  metatarsal-phalangeal joint. Electronically Signed   By: Lucienne Capers M.D.   On: 05/20/2017 21:51    Procedures Procedures (including critical care time)  Medications Ordered in ED Medications - No data to display   Initial Impression / Assessment and Plan / ED Course  I have reviewed the triage vital signs and the nursing notes.  Pertinent labs & imaging results that were available during my care of the patient were reviewed by me and considered in my medical decision making (see chart for details).     Vitals:   05/20/17 2121 05/20/17 2125 05/21/17 0017  BP: (!) 168/96  140/82  Pulse: (!) 120  (!) 101  Resp: 16  18  Temp: 98.1 F (36.7 C)  98.1 F (36.7 C)  TempSrc: Oral    SpO2: 100%  99%  Weight:  83.9 kg (185 lb)   Height:  5\' 4"  (1.626 m)      Andrea Gray is 42 y.o. female presenting with pain and swelling to right foot status post fall several days ago.  Neurovascularly intact, compartment soft.  X-ray negative.  Patient given crutches, advised rest, ice, compression and elevation.  I given her a as needed referral to [facility] in case she has persistent pain in several weeks.  Evaluation does not show pathology that would require ongoing emergent intervention or inpatient treatment. Pt is hemodynamically stable and mentating appropriately. Discussed findings and plan with patient/guardian, who agrees with care plan. All questions answered. Return precautions discussed and outpatient follow up given.      Final Clinical Impressions(s) / ED Diagnoses   Final diagnoses:  Foot sprain, right, initial encounter    ED Discharge Orders    None       Lott Seelbach, Charna Elizabeth 05/21/17 0101    Fatima Blank, MD 05/21/17 [phone]

## 2017-05-31 ENCOUNTER — Telehealth: Payer: Self-pay

## 2017-05-31 ENCOUNTER — Telehealth: Payer: 59 | Admitting: Family

## 2017-05-31 DIAGNOSIS — B9689 Other specified bacterial agents as the cause of diseases classified elsewhere: Secondary | ICD-10-CM

## 2017-05-31 DIAGNOSIS — J028 Acute pharyngitis due to other specified organisms: Secondary | ICD-10-CM

## 2017-05-31 MED ORDER — BENZONATATE 100 MG PO CAPS: 100.0000 mg | ORAL_CAPSULE | Freq: Three times a day (TID) | ORAL | 0 refills | Status: DC | PRN

## 2017-05-31 MED ORDER — PREDNISONE 5 MG PO TABS
5.0000 mg | ORAL_TABLET | ORAL | 0 refills | Status: DC
Start: 2017-05-31 — End: 2018-04-19

## 2017-05-31 MED ORDER — AZITHROMYCIN 250 MG PO TABS: ORAL_TABLET | ORAL | 0 refills | Status: DC

## 2017-05-31 MED FILL — PANTOPRAZOLE SOD DR 20 MG T: 20 | 30 days supply | Qty: 30 | Fill #0 | Status: TO

## 2017-05-31 MED FILL — NORETHINDRONE 0.35 MG TAB: 0.35 | 84 days supply | Qty: 84 | Fill #4

## 2017-05-31 NOTE — Progress Notes (Signed)
Thank you for the details you included in the comment boxes. Those details are very helpful in determining the best course of treatment for you and help [place] to provide the best care.  We are sorry that you are not feeling well.  Here is how we plan to help!  Based on your presentation I believe you most likely have A cough due to bacteria.  When patients have a fever and a productive cough with a change in color or increased sputum production, we are concerned about bacterial bronchitis.  If left untreated it can progress to pneumonia.  If your symptoms do not improve with your treatment plan it is important that you contact your provider.   I have prescribed Azithromyin 250 mg: two tablets now and then one tablet daily for 4 additonal days    In addition you may use A non-prescription cough medication called Mucinex DM: take 2 tablets every 12 hours. and A prescription cough medication called Tessalon Perles 100mg . You may take 1-2 capsules every 8 hours as needed for your cough.  Prednisone 5 mg daily for 6 days (taper, instructions on prescription bottle)  From your responses in the eVisit questionnaire you describe inflammation in the upper respiratory tract which is causing a significant cough.  This is commonly called Bronchitis and has four common causes:    Allergies  Viral Infections  Acid Reflux  Bacterial Infection Allergies, viruses and acid reflux are treated by controlling symptoms or eliminating the cause. An example might be a cough caused by taking certain blood pressure medications. You stop the cough by changing the medication. Another example might be a cough caused by acid reflux. Controlling the reflux helps control the cough.  USE OF BRONCHODILATOR ("RESCUE") INHALERS: There is a risk from using your bronchodilator too frequently.  The risk is that over-reliance on a medication which only relaxes the muscles surrounding the breathing tubes can reduce the effectiveness of  medications prescribed to reduce swelling and congestion of the tubes themselves.  Although you feel brief relief from the bronchodilator inhaler, your asthma may actually be worsening with the tubes becoming more swollen and filled with mucus.  This can delay other crucial treatments, such as oral steroid medications. If you need to use a bronchodilator inhaler daily, several times per day, you should discuss this with your provider.  There are probably better treatments that could be used to keep your asthma under control.     HOME CARE . Only take medications as instructed by your medical team. . Complete the entire course of an antibiotic. . Drink plenty of fluids and get plenty of rest. . Avoid close contacts especially the very young and the elderly . Cover your mouth if you cough or cough into your sleeve. . Always remember to wash your hands . A steam or ultrasonic humidifier can help congestion.   GET HELP RIGHT AWAY IF: . You develop worsening fever. . You become short of breath . You cough up blood. . Your symptoms persist after you have completed your treatment plan MAKE SURE YOU   Understand these instructions.  Will watch your condition.  Will get help right away if you are not doing well or get worse.  Your e-visit answers were reviewed by a board certified advanced clinical practitioner to complete your personal care plan.  Depending on the condition, your plan could have included both over the counter or prescription medications. If there is a problem please reply  once you  have received a response from your provider. Your safety is important to [place].  If you have drug allergies check your prescription carefully.    You can use MyChart to ask questions about today's visit, request a non-urgent call back, or ask for a work or school excuse for 24 hours related to this e-Visit. If it has been greater than 24 hours you will need to follow up with your provider, or enter a new  e-Visit to address those concerns. You will get an e-mail in the next two days asking about your experience.  I hope that your e-visit has been valuable and will speed your recovery. Thank you for using e-visits.

## 2017-05-31 NOTE — Telephone Encounter (Signed)
Called to f/u with pt to see how she is feeling since her visit with [place] and she states she feels good.

## 2017-06-26 MED FILL — PANTOPRAZOLE SOD DR 20 MG T: 20 | 30 days supply | Qty: 30 | Fill #0

## 2017-07-24 ENCOUNTER — Other Ambulatory Visit: Payer: Self-pay | Admitting: Physician Assistant

## 2017-07-24 MED FILL — AMLODIPINE BESYLATE 5 MG TA: 5 | 90 days supply | Qty: 90 | Fill #0

## 2017-07-24 MED FILL — PANTOPRAZOLE SOD DR 20 MG T: 20 | 30 days supply | Qty: 30 | Fill #1

## 2017-07-25 DIAGNOSIS — Z01419 Encounter for gynecological examination (general) (routine) without abnormal findings: Secondary | ICD-10-CM | POA: Diagnosis not present

## 2017-07-25 DIAGNOSIS — Z1231 Encounter for screening mammogram for malignant neoplasm of breast: Secondary | ICD-10-CM | POA: Diagnosis not present

## 2017-07-30 ENCOUNTER — Ambulatory Visit (INDEPENDENT_AMBULATORY_CARE_PROVIDER_SITE_OTHER): Payer: 59 | Admitting: Physician Assistant

## 2017-07-30 ENCOUNTER — Encounter: Payer: Self-pay | Admitting: Physician Assistant

## 2017-07-30 VITALS — BP 122/88 | HR 112 | Temp 98.5°F | Ht 64.0 in | Wt 188.0 lb

## 2017-07-30 DIAGNOSIS — R011 Cardiac murmur, unspecified: Secondary | ICD-10-CM

## 2017-07-30 DIAGNOSIS — I10 Essential (primary) hypertension: Secondary | ICD-10-CM

## 2017-07-30 NOTE — Patient Instructions (Addendum)
It was great to see you!  You will be contacted about your echo.  Please make an appointment with the lab on your way out. I would like for you to return for fasting cholesterol labs within 1-2 weeks. After midnight on the day of the lab draw, please do not eat anything. You may have water, black coffee, unsweetened tea.   Heart Murmur A heart murmur is an extra sound that is caused by chaotic blood flow. The murmur can be heard as a "hum" or "whoosh" sound when blood flows through the heart. The heart has four areas called chambers. Valves separate the upper and lower chambers from each other (tricuspid valve and mitral valve) and separate the lower chambers of the heart from pathways that lead away from the heart (aortic valve and pulmonary valve). Normally, the valves open to let blood flow through or out of your heart, and then they shut to keep the blood from flowing backward. There are two types of heart murmurs:  Innocent murmurs. Most people with this type of heart murmur do not have a heart problem. Many children have innocent heart murmurs. Your health care provider may suggest some basic testing to find out whether your murmur is an innocent murmur. If an innocent heart murmur is found, there is no need for further tests or treatment and no need to restrict activities or stop playing sports.  Abnormal murmurs. These types of murmurs can occur in children and adults. Abnormal murmurs may be a sign of a more serious heart condition, such as a heart defect present at birth (congenital defect) or heart valve disease.  What are the causes? This condition is caused by heart valves that are not working properly. In children, abnormal heart murmurs are typically caused by congenital defects. In adults, abnormal murmurs are usually from heart valve problems caused by disease, infection, or aging. Three types of heart valve defects can cause a murmur:  Regurgitation. This is when blood leaks back  through the valve in the wrong direction.  Mitral valve prolapse. This is when the mitral valve of the heart has a loose flap and does not close tightly.  Stenosis. This is when a valve does not open enough and blocks blood flow.  This condition may also be caused by:  Pregnancy.  Fever.  Overactive thyroid gland.  Anemia.  Exercise.  Rapid growth spurts (in children).  What are the signs or symptoms? Innocent murmurs do not cause symptoms, and many people with abnormal murmurs may or may not have symptoms. If symptoms do develop, they may include:  Shortness of breath.  Blue coloring of the skin, especially on the fingertips.  Chest pain.  Palpitations, or feeling a fluttering or skipped heartbeat.  Fainting.  Persistent cough.  Getting tired much faster than expected.  Swelling in the abdomen, feet, or ankles.  How is this diagnosed? This condition may be diagnosed during a routine physical or other exam. If your health care provider hears a murmur with a stethoscope, he or she will listen for:  Where the murmur is located in your heart.  How long the murmur lasts (duration).  When the murmur is heard during the heartbeat.  How loud the murmur is. This may help the health care provider figure out what is causing the murmur.  You may be referred to a heart specialist (cardiologist). You may also have other tests, including:  Electrocardiogram (ECG or EKG). This test measures the electrical activity of your heart.  Echocardiogram. This test uses high frequency sound waves to make pictures of your heart.  MRI or chest X-ray.  Cardiac catheterization. This test looks at blood flow through the heart.  For children and adults who have an abnormal heart murmur and want to stay active, it is important to complete testing, review test results, and receive recommendations from your health care provider. If heart disease is present, it may not be safe to play or be  active. How is this treated? Heart murmurs themselves do not need treatment. In some cases, a heart murmur may go away on its own. If an underlying problem or disease is causing the murmur, you may need treatment. If treatment is needed, it will depend on the type and severity of the disease or heart problem causing the murmur. Treatment may include:  Medicine.  Surgery.  Dietary and lifestyle changes.  Follow these instructions at home:  Talk with your health care provider before participating in sports or other activities that require a lot of effort and energy (are strenuous).  Learn as much as possible about your condition and any related diseases. Ask your health care provider if you may at risk for any medical emergencies.  Talk with your health care provider about what symptoms you should look out for.  It is up to you to get your test results. Ask your health care provider, or the department that is doing the test, when your results will be ready.  Keep all follow-up visits as told by your health care provider. This is important. Contact a health care provider if:  You feel light-headed.  You are frequently short of breath.  You feel more tired than usual.  You are having a hard time keeping up with normal activities or fitness routines.  You have swelling in your ankles or feet.  You have chest pain.  You notice that your heart often beats irregularly.  You develop any new symptoms. Get help right away if:  You develop severe chest pain.  You are having trouble breathing.  You have fainting spells.  Your symptoms suddenly get worse. These symptoms may represent a serious problem that is an emergency. Do not wait to see if the symptoms will go away. Get medical help right away. Call your local emergency services (911 in the [place] Do not drive yourself to the hospital. Summary  Normally, the heart valves open to let blood flow through or out of your heart,  and then they shut to keep the blood from flowing backward.  Heart murmur is caused by heart valves that are not working properly.  You may need treatment if an underlying problem or disease is causing the heart murmur. Treatment may include medicine, surgery, or dietary and lifestyle changes.  Talk with your health care provider before participating in sports or other activities that require a lot of effort and energy (are strenuous).  Talk with your health care provider about what symptoms you should watch out for. This information is not intended to replace advice given to you by your health care provider. Make sure you discuss any questions you have with your health care provider. Document Released: 03/09/2004 Document Revised: 01/19/2016 Document Reviewed: 01/19/2016 Elsevier Interactive Patient Education  [employer].

## 2017-07-30 NOTE — Progress Notes (Signed)
Andrea Gray is a 42 y.o. female here for a new problem.   History of Present Illness:   Chief Complaint  Patient presents with  . Heart Murmur    had appt with GYN last week(Wed) and she detected a murmur and suggested she get it checked out    HPI   Patient had an appointment with her ob-gyn on 07/25/17 and was told that she had a murmur. She isn't sure if she has ever been told that she has a murmur in the past. She did have a cardiology work-up in the past, was told that she had PVCs and wore a Holter monitor. She thinks that she also had an echo at that time, but she cannot remember. She states that this was at least 10-20 years ago. She presently denies: chest pain, SOB, syncope, pre-syncope, dizziness, excessive fatigue.  She has been checking her blood pressures at home and they all have been at goal.   Wt Readings from Last 3 Encounters:  07/30/17 188 lb (85.3 kg)  05/20/17 185 lb (83.9 kg)  05/20/17 187 lb (84.8 kg)     Past Medical History:  Diagnosis Date  . Family history of adverse reaction to anesthesia    Father has PONV  . Fibroadenoma of right breast   . GERD (gastroesophageal reflux disease)   . Headache   . Kidney stones 2007  . PVC's (premature ventricular contractions)    Hx: of  . Umbilical hernia      Social History   Socioeconomic History  . Marital status: Married    Spouse name: Not on file  . Number of children: Not on file  . Years of education: Not on file  . Highest education level: Not on file  Occupational History  . Not on file  Social Needs  . Financial resource strain: Not on file  . Food insecurity:    Worry: Not on file    Inability: Not on file  . Transportation needs:    Medical: Not on file    Non-medical: Not on file  Tobacco Use  . Smoking status: Never Smoker  . Smokeless tobacco: Never Used  Substance and Sexual Activity  . Alcohol use: Yes    Comment: daily wine 1-2 glasses  . Drug use: No  . Sexual activity:  Yes    Birth control/protection: Pill  Lifestyle  . Physical activity:    Days per week: Not on file    Minutes per session: Not on file  . Stress: Not on file  Relationships  . Social connections:    Talks on phone: Not on file    Gets together: Not on file    Attends religious service: Not on file    Active member of club or organization: Not on file    Attends meetings of clubs or organizations: Not on file    Relationship status: Not on file  . Intimate partner violence:    Fear of current or ex partner: Not on file    Emotionally abused: Not on file    Physically abused: Not on file    Forced sexual activity: Not on file  Other Topics Concern  . Not on file  Social History Narrative   She is a massage therapist   Husband is pharmacist   Has twin boys    Past Surgical History:  Procedure Laterality Date  . BREAST SURGERY  2017   removal of Fibroadenoma  . CESAREAN SECTION    .  CHOLECYSTECTOMY    . CYST EXCISION     from lip as a toddler  . DILATION AND CURETTAGE OF UTERUS    . LASIK    . LITHOTRIPSY    . MASS EXCISION Right 10/28/2015   Procedure: EXCISION OF RIGHT BREAST FIBROADENOMA;  Surgeon: Jackolyn Confer, MD;  Location: [facility];  Service: General;  Laterality: Right;  . WISDOM TOOTH EXTRACTION      Family History  Problem Relation Age of Onset  . Hypertension Mother   . COPD Mother   . Depression Mother   . Diabetes Mother   . Hypertension Father   . Alcohol abuse Brother   . COPD Brother   . Depression Brother   . Drug abuse Brother   . Hypertension Brother   . Learning disabilities Brother   . Mental illness Brother   . Arthritis Maternal Grandmother   . Asthma Maternal Grandmother   . Hypertension Maternal Grandmother   . Alcohol abuse Maternal Grandfather   . Hypertension Maternal Grandfather   . Arthritis Paternal Grandmother   . Cancer Paternal Grandmother        unknown type  . Early death Paternal Grandmother   . Hearing loss Paternal  Grandfather   . Breast cancer Other     Allergies  Allergen Reactions  . Penicillins     Has patient had a PCN reaction causing immediate rash, facial/tongue/throat swelling, SOB or lightheadedness with hypotension: Yes Has patient had a PCN reaction causing severe rash involving mucus membranes or skin necrosis: No Has patient had a PCN reaction that required hospitalization Yes Has patient had a PCN reaction occurring within the last 10 years: No If all of the above answers are "NO", then may proceed with Cephalosporin use.      Current Medications:   Current Outpatient Medications:  .  amLODipine (NORVASC) 5 MG tablet, TAKE 1 TABLET BY MOUTH DAILY, Disp: 90 tablet, Rfl: 0 .  ibuprofen (ADVIL,MOTRIN) 200 MG tablet, Take 400 mg by mouth every 6 (six) hours as needed for mild pain., Disp: , Rfl:  .  pantoprazole (PROTONIX) 20 MG tablet, Take 20 mg by mouth daily., Disp: , Rfl: 3 .  azithromycin (ZITHROMAX) 250 MG tablet, Take 2 tabs now then 1 daily times 4 more days (Patient not taking: Reported on 07/30/2017), Disp: 6 tablet, Rfl: 0 .  benzonatate (TESSALON PERLES) 100 MG capsule, Take 1-2 capsules (100-200 mg total) by mouth every 8 (eight) hours as needed for cough. (Patient not taking: Reported on 07/30/2017), Disp: 30 capsule, Rfl: 0 .  norethindrone (MICRONOR,CAMILA,ERRIN) 0.35 MG tablet, Take 1 tablet by mouth every evening., Disp: , Rfl: 4 .  omeprazole (PRILOSEC) 20 MG capsule, Take 20 mg by mouth daily as needed for heartburn., Disp: , Rfl: 0 .  pantoprazole sodium (PROTONIX) 40 mg/20 mL PACK, Protonix 40 mg granules delayed-release packet  Take 1 packet every day by oral route., Disp: , Rfl:  .  predniSONE (DELTASONE) 5 MG tablet, Take 1 tablet (5 mg total) by mouth as directed. Taper 6,5,4,3,2,1 (Patient not taking: Reported on 07/30/2017), Disp: 21 tablet, Rfl: 0   Review of Systems:   ROS  Negative unless otherwise specified per HPI.   Vitals:   Vitals:   07/30/17  1013 07/30/17 1032  BP: (!) 140/92 122/88  Pulse: (!) 112   Temp: 98.5 F (36.9 C)   TempSrc: Oral   SpO2: 98%   Weight: 188 lb (85.3 kg)   Height: 5\' 4"  (  1.626 m)      Body mass index is 32.27 kg/m.  Physical Exam:   Physical Exam  Constitutional: She appears well-developed. She is cooperative.  Non-toxic appearance. She does not have a sickly appearance. She does not appear ill. No distress.  Cardiovascular: Regular rhythm, S1 normal, S2 normal and normal pulses. Tachycardia present.  Murmur heard.  Systolic murmur is present with a grade of 3/6. No LE edema  Pulmonary/Chest: Effort normal and breath sounds normal.  Neurological: She is alert. GCS eye subscore is 4. GCS verbal subscore is 5. GCS motor subscore is 6.  Skin: Skin is warm, dry and intact.  Psychiatric: She has a normal mood and affect. Her speech is normal and behavior is normal.  Nursing note and vitals reviewed.   Assessment and Plan:    Aamani was seen today for heart murmur.  Diagnoses and all orders for this visit:  Heart murmur and Essential hypertension Will order echocardiogram. Currently asymptomatic. I discussed that if she develops any symptoms, to let us know right away. Blood pressure is presently well controlled, continue 5 mg Norvasc. She does not have a recent lipid panel, we will check one -- I have put in future order for this. We discussed cardiology referral, however patient would like to review results of echocardiogram first, I am in total agreement. Cardiology referral pending echo results. -     ECHOCARDIOGRAM COMPLETE; Future -     Lipid panel; Future   . Reviewed expectations re: course of current medical issues. . Discussed self-management of symptoms. . Outlined signs and symptoms indicating need for more acute intervention. . Patient verbalized understanding and all questions were answered. . See orders for this visit as documented in the electronic medical record. . Patient  received an After-Visit Summary.   Inda Coke, PA-C

## 2017-07-31 ENCOUNTER — Other Ambulatory Visit: Payer: Self-pay | Admitting: Physician Assistant

## 2017-07-31 ENCOUNTER — Other Ambulatory Visit (INDEPENDENT_AMBULATORY_CARE_PROVIDER_SITE_OTHER): Payer: 59

## 2017-07-31 DIAGNOSIS — I10 Essential (primary) hypertension: Secondary | ICD-10-CM

## 2017-07-31 DIAGNOSIS — R011 Cardiac murmur, unspecified: Secondary | ICD-10-CM

## 2017-07-31 LAB — BASIC METABOLIC PANEL
BUN: 15 mg/dL (ref 6–23)
CALCIUM: 9.8 mg/dL (ref 8.4–10.5)
CO2: 24 mEq/L (ref 19–32)
CREATININE: 0.81 mg/dL (ref 0.40–1.20)
Chloride: 105 mEq/L (ref 96–112)
GFR: 81.74 mL/min (ref >60.00)
GLUCOSE: 87 mg/dL (ref 70–99)
Potassium: 4 mEq/L (ref 3.5–5.1)
Sodium: 142 mEq/L (ref 135–145)

## 2017-07-31 LAB — LIPID PANEL
CHOL/HDL RATIO: 5
CHOLESTEROL: 256 mg/dL — AB (ref 0–200)
HDL: 51.4 mg/dL (ref >39.00)
NONHDL: 204.25
TRIGLYCERIDES: 238 mg/dL — AB (ref 0.0–149.0)
VLDL: 47.6 mg/dL — AB (ref 0.0–40.0)

## 2017-07-31 LAB — LDL CHOLESTEROL, DIRECT: Direct LDL: 152 mg/dL

## 2017-08-01 ENCOUNTER — Encounter: Payer: Self-pay | Admitting: Physician Assistant

## 2017-08-21 MED FILL — PANTOPRAZOLE SOD DR 20 MG T: 20 | 30 days supply | Qty: 30 | Fill #2

## 2017-08-30 MED FILL — NORETHINDRONE 0.35 MG TAB: 0.35 | 84 days supply | Qty: 84 | Fill #0

## 2017-10-01 ENCOUNTER — Encounter: Payer: Self-pay | Admitting: Physician Assistant

## 2017-10-01 NOTE — Telephone Encounter (Signed)
Hello, can you look into this. Pt has not been contacted.

## 2017-10-03 MED FILL — PANTOPRAZOLE SOD DR 20 MG T: 20 | 30 days supply | Qty: 30 | Fill #3

## 2017-10-11 ENCOUNTER — Other Ambulatory Visit: Payer: Self-pay

## 2017-10-22 ENCOUNTER — Ambulatory Visit (HOSPITAL_COMMUNITY): Payer: 59 | Attending: Cardiology

## 2017-10-22 ENCOUNTER — Other Ambulatory Visit: Payer: Self-pay

## 2017-10-22 DIAGNOSIS — I509 Heart failure, unspecified: Secondary | ICD-10-CM | POA: Diagnosis not present

## 2017-10-22 DIAGNOSIS — R011 Cardiac murmur, unspecified: Secondary | ICD-10-CM

## 2017-11-05 ENCOUNTER — Other Ambulatory Visit: Payer: Self-pay | Admitting: Physician Assistant

## 2017-11-05 MED FILL — AMLODIPINE BESYLATE 5 MG TA: 5 | 90 days supply | Qty: 90 | Fill #0

## 2017-11-06 MED FILL — PANTOPRAZOLE SOD DR 20 MG T: 20 | 90 days supply | Qty: 90 | Fill #0

## 2017-11-19 MED FILL — NORETHINDRONE 0.35 MG TAB: 0.35 | 84 days supply | Qty: 84 | Fill #1

## 2018-01-28 ENCOUNTER — Other Ambulatory Visit: Payer: Self-pay | Admitting: Physician Assistant

## 2018-01-28 MED FILL — PANTOPRAZOLE SOD DR 20 MG T: 20 | 90 days supply | Qty: 90 | Fill #1

## 2018-01-28 MED FILL — AMLODIPINE BESYLATE 5 MG TA: 5 | 90 days supply | Qty: 90 | Fill #0

## 2018-02-12 MED FILL — NORLYDA 0.35 MG TABS: 0.35 | 84 days supply | Qty: 84 | Fill #2

## 2018-03-19 DIAGNOSIS — F419 Anxiety disorder, unspecified: Secondary | ICD-10-CM | POA: Diagnosis not present

## 2018-03-19 MED FILL — busPIRone HCL 5 MG TABS: 5 | 45 days supply | Qty: 90 | Fill #0

## 2018-04-10 MED FILL — PANTOPRAZOLE SOD DR 20 MG T: 20 | 90 days supply | Qty: 90 | Fill #2 | Status: TO

## 2018-04-17 DIAGNOSIS — F419 Anxiety disorder, unspecified: Secondary | ICD-10-CM | POA: Diagnosis not present

## 2018-04-19 ENCOUNTER — Other Ambulatory Visit: Payer: Self-pay | Admitting: Physician Assistant

## 2018-04-19 ENCOUNTER — Other Ambulatory Visit: Payer: Self-pay | Admitting: *Deleted

## 2018-04-25 MED FILL — AMLODIPINE BESYLATE 5 MG TA: 5 | 90 days supply | Qty: 90 | Fill #0

## 2018-04-26 MED FILL — busPIRone HCL 5 MG TABS: 5 | 45 days supply | Qty: 90 | Fill #1

## 2018-04-29 MED FILL — NORLYDA 0.35 MG TABS: 0.35 | 84 days supply | Qty: 84 | Fill #3

## 2018-05-22 MED FILL — busPIRone HCL 10 MG TABS: 10 | 45 days supply | Qty: 90 | Fill #0

## 2018-06-24 MED FILL — PANTOPRAZOLE SOD DR 20 MG T: 20 | 90 days supply | Qty: 90 | Fill #0

## 2018-07-09 MED FILL — busPIRone HCL 10 MG TABS: 10 | 90 days supply | Qty: 180 | Fill #0

## 2018-07-31 ENCOUNTER — Other Ambulatory Visit: Payer: Self-pay | Admitting: Physician Assistant

## 2018-07-31 ENCOUNTER — Ambulatory Visit (INDEPENDENT_AMBULATORY_CARE_PROVIDER_SITE_OTHER): Payer: 59 | Admitting: Physician Assistant

## 2018-07-31 ENCOUNTER — Encounter: Payer: Self-pay | Admitting: Physician Assistant

## 2018-07-31 ENCOUNTER — Telehealth: Payer: Self-pay | Admitting: Physician Assistant

## 2018-07-31 VITALS — BP 121/79

## 2018-07-31 DIAGNOSIS — I10 Essential (primary) hypertension: Secondary | ICD-10-CM | POA: Diagnosis not present

## 2018-07-31 MED FILL — AMLODIPINE BESYLATE 5 MG TA: 5 | 90 days supply | Qty: 90 | Fill #0

## 2018-07-31 NOTE — Progress Notes (Signed)
Virtual Visit via Video   I connected with Andrea Gray on 07/31/18 at  3:20 PM EDT by a video enabled telemedicine application and verified that I am speaking with the correct person using two identifiers. Location patient: Home Location provider: [facility], Office Persons participating in the virtual visit: Shamika, [place] PA-C.  I discussed the limitations of evaluation and management by telemedicine and the availability of in person appointments. The patient expressed understanding and agreed to proceed.  I acted as a Education administrator for [employer], [employer], LPN  Subjective:   HPI:  Hypertension Pt following up today on blood pressure, needing refill on medication. Currently taking Amlodipine 5 mg daily. She is checking blood pressure occasionally. Denies headaches, dizziness, blurred vision, chest pain, SOB or lower leg edema. Denies excessive caffeine intake, stimulant usage, excessive alcohol intake or increase in salt consumption.   ROS: See pertinent positives and negatives per HPI.  Patient Active Problem List   Diagnosis Date Noted  . Hypertension 11/27/2016    Social History   Tobacco Use  . Smoking status: Never Smoker  . Smokeless tobacco: Never Used  Substance Use Topics  . Alcohol use: Yes    Comment: daily wine 1-2 glasses    Current Outpatient Medications:  .  busPIRone (BUSPAR) 10 MG tablet, Take 10 mg by mouth 2 (two) times daily. , Disp: , Rfl:  .  ibuprofen (ADVIL,MOTRIN) 200 MG tablet, Take 400 mg by mouth every 6 (six) hours as needed for mild pain., Disp: , Rfl:  .  norethindrone (MICRONOR,CAMILA,ERRIN) 0.35 MG tablet, Take 1 tablet by mouth every evening., Disp: , Rfl: 4 .  pantoprazole (PROTONIX) 20 MG tablet, Take 20 mg by mouth daily. , Disp: , Rfl:  .  amLODipine (NORVASC) 5 MG tablet, TAKE 1 TABLET BY MOUTH DAILY, Disp: 90 tablet, Rfl: 3  Allergies  Allergen Reactions  . Penicillins     Has patient had a PCN  reaction causing immediate rash, facial/tongue/throat swelling, SOB or lightheadedness with hypotension: Yes Has patient had a PCN reaction causing severe rash involving mucus membranes or skin necrosis: No Has patient had a PCN reaction that required hospitalization Yes Has patient had a PCN reaction occurring within the last 10 years: No If all of the above answers are "NO", then may proceed with Cephalosporin use.      Objective:   VITALS: Per patient if applicable, see vitals. GENERAL: Alert, appears well and in no acute distress. HEENT: Atraumatic, conjunctiva clear, no obvious abnormalities on inspection of external nose and ears. NECK: Normal movements of the head and neck. CARDIOPULMONARY: No increased WOB. Speaking in clear sentences. I:E ratio WNL.  MS: Moves all visible extremities without noticeable abnormality. PSYCH: Pleasant and cooperative, well-groomed. Speech normal rate and rhythm. Affect is appropriate. Insight and judgement are appropriate. Attention is focused, linear, and appropriate.  NEURO: CN grossly intact. Oriented as arrived to appointment on time with no prompting. Moves both UE equally.  SKIN: No obvious lesions, wounds, erythema, or cyanosis noted on face or hands.  Assessment and Plan:   Wilene was seen today for hypertension.  Diagnoses and all orders for this visit:  Essential hypertension   Well controlled. Continue Norvasc 5 mg daily. Follow-up in 1 year, sooner if concerns.  . Reviewed expectations re: course of current medical issues. . Discussed self-management of symptoms. . Outlined signs and symptoms indicating need for more acute intervention. . Patient verbalized understanding and all  questions were answered. Marland Kitchen Health Maintenance issues including appropriate healthy diet, exercise, and smoking avoidance were discussed with patient. . See orders for this visit as documented in the electronic medical record.  I discussed the assessment  and treatment plan with the patient. The patient was provided an opportunity to ask questions and all were answered. The patient agreed with the plan and demonstrated an understanding of the instructions.   The patient was advised to call back or seek an in-person evaluation if the symptoms worsen or if the condition fails to improve as anticipated.   CMA or LPN served as scribe during this visit. History, Physical, and Plan performed by medical provider. The above documentation has been reviewed and is accurate and complete.   [place], [place] 07/31/2018

## 2018-07-31 NOTE — Telephone Encounter (Signed)
error 

## 2018-08-02 MED FILL — NORLYDA 0.35 MG TABS: 0.35 | 84 days supply | Qty: 84 | Fill #0

## 2018-09-19 MED FILL — PANTOPRAZOLE SOD DR 20 MG T: 20 | 90 days supply | Qty: 90 | Fill #0

## 2018-10-09 ENCOUNTER — Telehealth: Payer: 59 | Admitting: Nurse Practitioner

## 2018-10-09 DIAGNOSIS — H1031 Unspecified acute conjunctivitis, right eye: Secondary | ICD-10-CM | POA: Diagnosis not present

## 2018-10-09 MED ORDER — POLYMYXIN B-TRIMETHOPRIM 10000-0.1 UNIT/ML-% OP SOLN: 2.0000 [drp] | OPHTHALMIC | 0 refills | Status: DC

## 2018-10-09 MED FILL — busPIRone HCL 10 MG TABS: 10 | 45 days supply | Qty: 90 | Fill #1

## 2018-10-09 NOTE — Progress Notes (Signed)
We are sorry that you are not feeling well.  Here is how we plan to help!  Based on what you have shared with me it looks like you have conjunctivitis.  Conjunctivitis is a common inflammatory or infectious condition of the eye that is often referred to as "pink eye".  In most cases it is contagious (viral or bacterial). However, not all conjunctivitis requires antibiotics (ex. Allergic).  We have made appropriate suggestions for you based upon your presentation.  I have prescribed Polytrim Ophthalmic drops 1-2 drops 4 times a day times 5 days  Pink eye can be highly contagious.  It is typically spread through direct contact with secretions, or contaminated objects or surfaces that one may have touched.  Strict handwashing is suggested with soap and water is urged.  If not available, use alcohol based had sanitizer.  Avoid unnecessary touching of the eye.  If you wear contact lenses, you will need to refrain from wearing them until you see no white discharge from the eye for at least 24 hours after being on medication.  You should see symptom improvement in 1-2 days after starting the medication regimen.  Call us if symptoms are not improved in 1-2 days.  Home Care:  Wash your hands often!  Do not wear your contacts until you complete your treatment plan.  Avoid sharing towels, bed linen, personal items with a person who has pink eye.  See attention for anyone in your home with similar symptoms.  Get Help Right Away If:  Your symptoms do not improve.  You develop blurred or loss of vision.  Your symptoms worsen (increased discharge, pain or redness)  Your e-visit answers were reviewed by a board certified advanced clinical practitioner to complete your personal care plan.  Depending on the condition, your plan could have included both over the counter or prescription medications.  If there is a problem please reply  once you have received a response from your provider.  Your safety is  important to us.  If you have drug allergies check your prescription carefully.    You can use MyChart to ask questions about today's visit, request a non-urgent call back, or ask for a work or school excuse for 24 hours related to this e-Visit. If it has been greater than 24 hours you will need to follow up with your provider, or enter a new e-Visit to address those concerns.   You will get an e-mail in the next two days asking about your experience.  I hope that your e-visit has been valuable and will speed your recovery. Thank you for using e-visits.   5-10 minutes spent reviewing and documenting in chart.   

## 2018-10-23 MED FILL — NORLYDA 0.35 MG TABS: 0.35 | 84 days supply | Qty: 84 | Fill #1

## 2018-10-23 MED FILL — AMLODIPINE BESYLATE 5 MG TA: 5 | 90 days supply | Qty: 90 | Fill #1

## 2018-10-28 ENCOUNTER — Ambulatory Visit (INDEPENDENT_AMBULATORY_CARE_PROVIDER_SITE_OTHER): Payer: 59

## 2018-10-28 ENCOUNTER — Encounter: Payer: Self-pay | Admitting: Podiatry

## 2018-10-28 ENCOUNTER — Ambulatory Visit (INDEPENDENT_AMBULATORY_CARE_PROVIDER_SITE_OTHER): Payer: 59 | Admitting: Podiatry

## 2018-10-28 ENCOUNTER — Other Ambulatory Visit: Payer: Self-pay | Admitting: Podiatry

## 2018-10-28 ENCOUNTER — Other Ambulatory Visit: Payer: Self-pay

## 2018-10-28 VITALS — BP 94/72 | HR 75

## 2018-10-28 DIAGNOSIS — M659 Synovitis and tenosynovitis, unspecified: Secondary | ICD-10-CM | POA: Diagnosis not present

## 2018-10-28 DIAGNOSIS — M779 Enthesopathy, unspecified: Secondary | ICD-10-CM | POA: Diagnosis not present

## 2018-10-28 DIAGNOSIS — M79671 Pain in right foot: Secondary | ICD-10-CM

## 2018-10-28 DIAGNOSIS — M778 Other enthesopathies, not elsewhere classified: Secondary | ICD-10-CM

## 2018-10-28 DIAGNOSIS — M65971 Unspecified synovitis and tenosynovitis, right ankle and foot: Secondary | ICD-10-CM

## 2018-10-28 DIAGNOSIS — M25571 Pain in right ankle and joints of right foot: Secondary | ICD-10-CM

## 2018-10-28 MED ORDER — METHYLPREDNISOLONE 4 MG PO TBPK: ORAL_TABLET | ORAL | 0 refills | Status: DC

## 2018-10-28 MED ORDER — MELOXICAM 15 MG PO TABS: 15.0000 mg | ORAL_TABLET | Freq: Every day | ORAL | 1 refills | Status: DC

## 2018-10-28 MED FILL — MELOXICAM 15 MG TABLET: 15 | 30 days supply | Qty: 30 | Fill #0

## 2018-10-28 MED FILL — METHYLPREDNISOLONE 4 MG TAB: 4 | 6 days supply | Qty: 21 | Fill #0

## 2018-10-30 NOTE — Progress Notes (Signed)
   Subjective:  43 y.o. female presenting today as a new patient with a chief complaint of intermittent sharp, shooting right foot and ankle pain that began after an injury in March of 2019. She states she sprained the ankle and it has never fully healed. She reports associated swelling to the dorsal right foot. She has not had any recent treatment. Walking and being on the foot for a long period of time increases the pain. Patient is here for further evaluation and treatment.   Past Medical History:  Diagnosis Date  . Family history of adverse reaction to anesthesia    Father has PONV  . Fibroadenoma of right breast   . GERD (gastroesophageal reflux disease)   . Headache   . Kidney stones 2007  . PVC's (premature ventricular contractions)    Hx: of  . Umbilical hernia      Objective / Physical Exam:  General:  The patient is alert and oriented x3 in no acute distress. Dermatology:  Skin is warm, dry and supple bilateral lower extremities. Negative for open lesions or macerations. Vascular:  Palpable pedal pulses bilaterally. No edema or erythema noted. Capillary refill within normal limits. Neurological:  Epicritic and protective threshold grossly intact bilaterally.  Musculoskeletal Exam:  Pain on palpation to the anterior lateral medial aspects of the patient's right ankle. Mild edema noted. Pain with palpation noted to the right midfoot. Range of motion within normal limits to all pedal and ankle joints bilateral. Muscle strength 5/5 in all groups bilateral.   Radiographic Exam:  Normal osseous mineralization. Joint spaces preserved. No fracture/dislocation/boney destruction.    Assessment: 1. H/o severe ankle sprain right March 2019 2. Ankle synovitis right 3. Midfoot capsulitis right   Plan of Care:  1. Patient was evaluated. X-Rays reviewed.  2. Injection of 0.5 mL Celestone Soluspan injected in the patient's right ankle. 3. Injection of 0.5 mLs Celestone Soluspan  injected into the right midfoot.  4. Recommended CAM boot. Weightbearing as tolerated.  5. Prescription for Medrol Dose Pak provided to patient. 6. Prescription for Meloxicam provided to patient. 7. Return to clinic in 4 weeks.   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    Edrick Kins, DPM [facility]  Dr. Edrick Kins, [address]                                        [place], [place] [address]                Office [phone]  Fax [phone]

## 2018-11-06 MED FILL — busPIRone HCL 5 MG TABS: 5 | 45 days supply | Qty: 90 | Fill #2

## 2018-11-25 ENCOUNTER — Ambulatory Visit (INDEPENDENT_AMBULATORY_CARE_PROVIDER_SITE_OTHER): Payer: 59 | Admitting: Podiatry

## 2018-11-25 ENCOUNTER — Other Ambulatory Visit: Payer: Self-pay

## 2018-11-25 DIAGNOSIS — M778 Other enthesopathies, not elsewhere classified: Secondary | ICD-10-CM | POA: Diagnosis not present

## 2018-11-25 DIAGNOSIS — M659 Synovitis and tenosynovitis, unspecified: Secondary | ICD-10-CM

## 2018-11-28 NOTE — Progress Notes (Signed)
   Subjective:  43 y.o. female presenting today as a new patient with a chief complaint of intermittent sharp, shooting right foot and ankle pain that began after an injury in March of 2019. She states she sprained the ankle and it has never fully healed. She reports associated swelling to the dorsal right foot. She has not had any recent treatment. Walking and being on the foot for a long period of time increases the pain. Patient is here for further evaluation and treatment.   Past Medical History:  Diagnosis Date  . Family history of adverse reaction to anesthesia    Father has PONV  . Fibroadenoma of right breast   . GERD (gastroesophageal reflux disease)   . Headache   . Kidney stones 2007  . PVC's (premature ventricular contractions)    Hx: of  . Umbilical hernia      Objective / Physical Exam:  General:  The patient is alert and oriented x3 in no acute distress. Dermatology:  Skin is warm, dry and supple bilateral lower extremities. Negative for open lesions or macerations. Vascular:  Palpable pedal pulses bilaterally. No edema or erythema noted. Capillary refill within normal limits. Neurological:  Epicritic and protective threshold grossly intact bilaterally.  Musculoskeletal Exam:  Pain on palpation to the anterior lateral medial aspects of the patient's right ankle. Mild edema noted. Pain with palpation noted to the right midfoot. Range of motion within normal limits to all pedal and ankle joints bilateral. Muscle strength 5/5 in all groups bilateral.    Assessment: 1. H/o severe ankle sprain right March 2019 2. Ankle synovitis right - improved  3. Midfoot capsulitis right - improved   Plan of Care:  1. Patient was evaluated.  2. Continue taking OTC Motrin as needed.  3. Discontinue using CAM boot.  4. Ankle brace dispensed.  5. Return to clinic as needed.   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    Edrick Kins, DPM [facility]  Dr. Edrick Kins, [address]                                        [place], [place] [address]                Office [phone]  Fax [phone]

## 2018-12-05 ENCOUNTER — Encounter: Payer: Self-pay | Admitting: Podiatry

## 2018-12-18 MED FILL — PANTOPRAZOLE SOD DR 20 MG T: 20 | 90 days supply | Qty: 90 | Fill #1

## 2018-12-25 ENCOUNTER — Encounter: Payer: Self-pay | Admitting: Podiatry

## 2019-01-08 MED FILL — busPIRone HCL 10 MG TABS: 10 | 45 days supply | Qty: 90 | Fill #0

## 2019-01-15 MED FILL — NORLYDA 0.35 MG TABS: 0.35 | 84 days supply | Qty: 84 | Fill #2

## 2019-01-20 ENCOUNTER — Encounter: Payer: Self-pay | Admitting: Podiatry

## 2019-01-20 ENCOUNTER — Other Ambulatory Visit: Payer: Self-pay

## 2019-01-20 ENCOUNTER — Ambulatory Visit (INDEPENDENT_AMBULATORY_CARE_PROVIDER_SITE_OTHER): Payer: 59 | Admitting: Podiatry

## 2019-01-20 DIAGNOSIS — M778 Other enthesopathies, not elsewhere classified: Secondary | ICD-10-CM | POA: Diagnosis not present

## 2019-01-20 DIAGNOSIS — S93324D Dislocation of tarsometatarsal joint of right foot, subsequent encounter: Secondary | ICD-10-CM

## 2019-01-20 MED ORDER — METHYLPREDNISOLONE 4 MG PO TBPK: ORAL_TABLET | ORAL | 0 refills | Status: DC

## 2019-01-20 MED FILL — METHYLPREDNISOLONE 4 MG TAB: 4 | 6 days supply | Qty: 21 | Fill #0

## 2019-01-21 ENCOUNTER — Telehealth: Payer: Self-pay | Admitting: *Deleted

## 2019-01-21 DIAGNOSIS — M659 Synovitis and tenosynovitis, unspecified: Secondary | ICD-10-CM

## 2019-01-21 DIAGNOSIS — S93324D Dislocation of tarsometatarsal joint of right foot, subsequent encounter: Secondary | ICD-10-CM

## 2019-01-21 DIAGNOSIS — M778 Other enthesopathies, not elsewhere classified: Secondary | ICD-10-CM

## 2019-01-21 MED FILL — TOBRAMYCIN-DEXAMETH OPTH SU: 0.3-0.1 | 9 days supply | Qty: 5 | Fill #0

## 2019-01-21 NOTE — Telephone Encounter (Signed)
-----   Message from Edrick Kins, DPM sent at 01/20/2019  2:42 PM EST ----- Regarding: MRI RT midfoot Please order MRI RT midfoot w/out contrast.   Dx: lisfranc fracture/dislocation - chronic RT  Thanks, Dr. Amalia Hailey

## 2019-01-21 NOTE — Telephone Encounter (Signed)
Orders to G. Hassell Done, CMA Dr. Amalia Hailey assistant, faxed to [facility] - Radiology Scheduling.

## 2019-01-22 NOTE — Progress Notes (Signed)
   HPI: 43 y.o. female presenting today for follow up evaluation of right foot and ankle pain. She states her symptoms had improved for a while but then returned a few weeks ago ago. She states she is now having the same pain she was having initially. She has been taking OTC Motrin for treatment. Being on the foot increases the pain. Patient is here for further evaluation and treatment.   Past Medical History:  Diagnosis Date  . Family history of adverse reaction to anesthesia    Father has PONV  . Fibroadenoma of right breast   . GERD (gastroesophageal reflux disease)   . Headache   . Kidney stones 2007  . PVC's (premature ventricular contractions)    Hx: of  . Umbilical hernia      Physical Exam: General: The patient is alert and oriented x3 in no acute distress.  Dermatology: Skin is warm, dry and supple bilateral lower extremities. Negative for open lesions or macerations.  Vascular: Palpable pedal pulses bilaterally. No edema or erythema noted. Capillary refill within normal limits.  Neurological: Epicritic and protective threshold grossly intact bilaterally.   Musculoskeletal Exam: Pain with palpation noted over the LisFranc joint of the right foot. Range of motion within normal limits to all pedal and ankle joints bilateral. Muscle strength 5/5 in all groups bilateral.   Assessment: 1. H/o severe ankle sprain right March 2019 2. Suspect LisFranc fracture / dislocation right - chronic    Plan of Care:  1. Patient evaluated.   2. Prescription for Medrol Dose Pak provided to patient. 3. Resume using CAM boot.  4. MRI of the right midfoot ordered.  5. Return to clinic after MRI to review results.   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      Edrick Kins, DPM [facility]  Dr. Edrick Kins, DPM    [address], [place] [address]                Office [phone]  Fax  [phone]

## 2019-01-29 MED FILL — busPIRone HCL 5 MG TABS: 5 | 45 days supply | Qty: 90 | Fill #3

## 2019-01-29 MED FILL — AMLODIPINE BESYLATE 5 MG TA: 5 | 90 days supply | Qty: 90 | Fill #2

## 2019-02-01 ENCOUNTER — Ambulatory Visit: Payer: 59

## 2019-02-25 ENCOUNTER — Ambulatory Visit: Payer: 59 | Attending: Internal Medicine

## 2019-02-25 DIAGNOSIS — Z23 Encounter for immunization: Secondary | ICD-10-CM | POA: Diagnosis not present

## 2019-02-25 NOTE — Progress Notes (Signed)
   Covid-19 Vaccination Clinic  Name:  Andrea Gray    MRN: [id number] DOB: 11/19/1975  02/25/2019  [name] was observed post Covid-19 immunization for 30 minutes based on pre-vaccination screening without incidence. She was provided with Vaccine Information Sheet and instruction to access the V-Safe system.   [name] was instructed to call 911 with any severe reactions post vaccine: Marland Kitchen Difficulty breathing  . Swelling of your face and throat  . A fast heartbeat  . A bad rash all over your body  . Dizziness and weakness    Immunizations Administered    Name Date Dose VIS Date Route   Pfizer COVID-19 Vaccine 02/25/2019 12:31 PM 0.3 mL 01/24/2019 Intramuscular   Manufacturer: [place]   Lot: [id number]   [place]: [id number]

## 2019-03-12 MED FILL — PANTOPRAZOLE SOD DR 20 MG T: 20 | 90 days supply | Qty: 90 | Fill #2

## 2019-03-15 ENCOUNTER — Ambulatory Visit: Payer: Self-pay | Attending: Internal Medicine

## 2019-03-15 DIAGNOSIS — Z23 Encounter for immunization: Secondary | ICD-10-CM | POA: Insufficient documentation

## 2019-03-15 NOTE — Progress Notes (Signed)
   Covid-19 Vaccination Clinic  Name:  SHYANN YANNUZZI    MRN: [id number] DOB: Mar 26, 1975  03/15/2019  Ms. Finnigan was observed post Covid-19 immunization for 15 minutes without incidence. She was provided with Vaccine Information Sheet and instruction to access the V-Safe system.   Ms. Delion was instructed to call 911 with any severe reactions post vaccine: Marland Kitchen Difficulty breathing  . Swelling of your face and throat  . A fast heartbeat  . A bad rash all over your body  . Dizziness and weakness    Immunizations Administered    Name Date Dose VIS Date Route   Pfizer COVID-19 Vaccine 03/15/2019 12:20 PM 0.3 mL 01/24/2019 Intramuscular   Manufacturer: [place]   Lot: [id number]   [place]: [id number]

## 2019-04-02 MED FILL — busPIRone HCL 10 MG TABS: 10 | 45 days supply | Qty: 90 | Fill #1

## 2019-04-11 MED FILL — NORLYDA 0.35 MG TABS: 0.35 | 84 days supply | Qty: 84 | Fill #3

## 2019-04-15 ENCOUNTER — Encounter: Payer: Self-pay | Admitting: Podiatry

## 2019-04-16 MED FILL — AMLODIPINE BESYLATE 5 MG TA: 5 | 90 days supply | Qty: 90 | Fill #3

## 2019-04-30 ENCOUNTER — Other Ambulatory Visit (HOSPITAL_COMMUNITY): Payer: Self-pay | Admitting: Obstetrics and Gynecology

## 2019-04-30 MED FILL — busPIRone HCL 5 MG TABS: 5 | 45 days supply | Qty: 90 | Fill #0

## 2019-06-09 ENCOUNTER — Other Ambulatory Visit: Payer: Self-pay

## 2019-06-09 ENCOUNTER — Encounter: Payer: Self-pay | Admitting: Physician Assistant

## 2019-06-09 ENCOUNTER — Other Ambulatory Visit: Payer: Self-pay | Admitting: Physician Assistant

## 2019-06-09 ENCOUNTER — Ambulatory Visit (INDEPENDENT_AMBULATORY_CARE_PROVIDER_SITE_OTHER): Payer: 59 | Admitting: Physician Assistant

## 2019-06-09 VITALS — BP 124/80 | HR 105 | Temp 98.3°F | Ht 65.0 in | Wt 181.2 lb

## 2019-06-09 DIAGNOSIS — I10 Essential (primary) hypertension: Secondary | ICD-10-CM | POA: Diagnosis not present

## 2019-06-09 DIAGNOSIS — F419 Anxiety disorder, unspecified: Secondary | ICD-10-CM | POA: Diagnosis not present

## 2019-06-09 DIAGNOSIS — R252 Cramp and spasm: Secondary | ICD-10-CM

## 2019-06-09 DIAGNOSIS — Z13220 Encounter for screening for lipoid disorders: Secondary | ICD-10-CM

## 2019-06-09 DIAGNOSIS — Z136 Encounter for screening for cardiovascular disorders: Secondary | ICD-10-CM

## 2019-06-09 DIAGNOSIS — Z0000 Encounter for general adult medical examination without abnormal findings: Secondary | ICD-10-CM | POA: Diagnosis not present

## 2019-06-09 DIAGNOSIS — E669 Obesity, unspecified: Secondary | ICD-10-CM

## 2019-06-09 DIAGNOSIS — R7989 Other specified abnormal findings of blood chemistry: Secondary | ICD-10-CM

## 2019-06-09 LAB — CBC WITH DIFFERENTIAL/PLATELET
Basophils Absolute: 0 10*3/uL (ref 0.0–0.1)
Basophils Relative: 0.8 % (ref 0.0–3.0)
Eosinophils Absolute: 0.2 10*3/uL (ref 0.0–0.7)
Eosinophils Relative: 2.9 % (ref 0.0–5.0)
HCT: 39.9 % (ref 36.0–46.0)
Hemoglobin: 13.6 g/dL (ref 12.0–15.0)
Lymphocytes Relative: 27 % (ref 12.0–46.0)
Lymphs Abs: 1.7 10*3/uL (ref 0.7–4.0)
MCHC: 34.1 g/dL (ref 30.0–36.0)
MCV: 98.2 fl (ref 78.0–100.0)
Monocytes Absolute: 0.5 10*3/uL (ref 0.1–1.0)
Monocytes Relative: 8 % (ref 3.0–12.0)
Neutro Abs: 3.8 10*3/uL (ref 1.4–7.7)
Neutrophils Relative %: 61.3 % (ref 43.0–77.0)
Platelets: 220 10*3/uL (ref 150.0–400.0)
RBC: 4.07 Mil/uL (ref 3.87–5.11)
RDW: 13.6 % (ref 11.5–15.5)
WBC: 6.2 10*3/uL (ref 4.0–10.5)

## 2019-06-09 LAB — COMPREHENSIVE METABOLIC PANEL
ALT: 79 U/L — ABNORMAL HIGH (ref 0–35)
AST: 56 U/L — ABNORMAL HIGH (ref 0–37)
Albumin: 4.6 g/dL (ref 3.5–5.2)
Alkaline Phosphatase: 47 U/L (ref 39–117)
BUN: 14 mg/dL (ref 6–23)
CO2: 25 mEq/L (ref 19–32)
Calcium: 9.5 mg/dL (ref 8.4–10.5)
Chloride: 102 mEq/L (ref 96–112)
Creatinine, Ser: 0.77 mg/dL (ref 0.40–1.20)
GFR: 80.85 mL/min (ref >60.00)
Glucose, Bld: 80 mg/dL (ref 70–99)
Potassium: 4.1 mEq/L (ref 3.5–5.1)
Sodium: 139 mEq/L (ref 135–145)
Total Bilirubin: 0.8 mg/dL (ref 0.2–1.2)
Total Protein: 6.8 g/dL (ref 6.0–8.3)

## 2019-06-09 LAB — LIPID PANEL
Cholesterol: 245 mg/dL — ABNORMAL HIGH (ref 0–200)
HDL: 63.1 mg/dL (ref >39.00)
LDL Cholesterol: 159 mg/dL — ABNORMAL HIGH (ref 0–99)
NonHDL: 182.14
Total CHOL/HDL Ratio: 4
Triglycerides: 116 mg/dL (ref 0.0–149.0)
VLDL: 23.2 mg/dL (ref 0.0–40.0)

## 2019-06-09 MED FILL — PANTOPRAZOLE SOD DR 20 MG T: 20 | 90 days supply | Qty: 90 | Fill #3

## 2019-06-09 NOTE — Progress Notes (Signed)
I acted as a Education administrator for [employer], PA-C Anselmo Pickler, LPN    Subjective:    Andrea Gray is a 45 y.o. female and is here for a comprehensive physical exam.   HPI  Health Maintenance Due  Topic Date Due  . PAP SMEAR-Modifier  07/04/2019    Acute Concerns: Foot cramping -- has had two episodes of intense foot cramping, once was [place] and the other episode was yesterday after standing for most of the day. The pain is sudden and is a cramping sensation and lasts about 5 minutes. She has gotten a bruise associated with this afterwards. Denies calf tenderness/swelling.  Chronic Issues: HTN -- Currently taking Norvasc 5 mg. At home blood pressure readings are: well controlled per patient. Patient denies chest pain, SOB, blurred vision, dizziness, unusual headaches, lower leg swelling. Patient is compliant with medication. Denies excessive caffeine intake, stimulant usage, excessive alcohol intake, or increase in salt consumption.  BP Readings from Last 3 Encounters:  06/09/19 124/80  10/28/18 94/72  07/31/18 121/79   Anxiety -- was taking 10mg  in buspar in pm and then 5 mg in the am but she found that the 10 mg was making her drowsy so she is now taking 5 mg in the pm. Feels like this is controlling her symptoms well.  Health Maintenance: Immunizations -- UTD Colonoscopy -- n/a -- will start at age 42 Mammogram -- due 5/21 done at GYN PAP -- due 06/2019 has appt scheduled with [facility] Bone Density -- N/A Diet -- doing low carbohydrate and keto Sleep habits -- no concerns Exercise -- walks regularly Weght -- Weight: 181 lb 4 oz (82.2 kg) --> has intentionally lost weight Mood -- stable Weight history: Wt Readings from Last 10 Encounters:  06/09/19 181 lb 4 oz (82.2 kg)  07/30/17 188 lb (85.3 kg)  05/20/17 185 lb (83.9 kg)  05/20/17 187 lb (84.8 kg)  05/15/17 185 lb (83.9 kg)  12/25/16 187 lb 8 oz (85 kg)  11/27/16 189  lb (85.7 kg)  10/28/15 185 lb (83.9 kg)  10/27/15 185 lb 14.4 oz (84.3 kg)   Patient's last menstrual period was 05/25/2019. Alcohol use: 1-2 glasses of wine per night Tobacco use: none  Depression screen PHQ 2/9 06/09/2019  Decreased Interest 0  Down, Depressed, Hopeless 0  PHQ - 2 Score 0   UTD with eye and dentist  Other providers/specialists: Patient Care Team: Inda Coke, [place] as PCP - General (Physician Assistant)    PMHx, SurgHx, SocialHx, Medications, and Allergies were reviewed in the Visit Navigator and updated as appropriate.   Past Medical History:  Diagnosis Date  . Family history of adverse reaction to anesthesia    Father has PONV  . Fibroadenoma of right breast   . GERD (gastroesophageal reflux disease)   . Headache   . Kidney stones 2007  . PVC's (premature ventricular contractions)    Hx: of  . Umbilical hernia      Past Surgical History:  Procedure Laterality Date  . BREAST SURGERY  2017   removal of Fibroadenoma  . CESAREAN SECTION    . CHOLECYSTECTOMY    . CYST EXCISION     from lip as a toddler  . DILATION AND CURETTAGE OF UTERUS    . LASIK    . LITHOTRIPSY    . MASS EXCISION Right 10/28/2015   Procedure: EXCISION OF RIGHT BREAST FIBROADENOMA;  Surgeon: Jackolyn Confer, MD;  Location: [facility] OR;  Service: General;  Laterality: Right;  . WISDOM TOOTH EXTRACTION       Family History  Problem Relation Age of Onset  . Hypertension Mother   . COPD Mother   . Depression Mother   . Diabetes Mother   . Hypertension Father   . Alcohol abuse Brother   . COPD Brother   . Depression Brother   . Drug abuse Brother   . Hypertension Brother   . Learning disabilities Brother   . Mental illness Brother   . Arthritis Maternal Grandmother   . Asthma Maternal Grandmother   . Hypertension Maternal Grandmother   . Alcohol abuse Maternal Grandfather   . Hypertension Maternal Grandfather   . Arthritis Paternal Grandmother   . Cancer Paternal  Grandmother        unknown type  . Early death Paternal Grandmother   . Hearing loss Paternal Grandfather   . Breast cancer Other     Social History   Tobacco Use  . Smoking status: Never Smoker  . Smokeless tobacco: Never Used  Substance Use Topics  . Alcohol use: Yes    Comment: daily wine 1-2 glasses  . Drug use: No    Review of Systems:   Review of Systems  Constitutional: Negative for chills, fever, malaise/fatigue and weight loss.  HENT: Negative for hearing loss, sinus pain and sore throat.   Eyes: Negative for blurred vision.  Respiratory: Negative for cough and shortness of breath.   Cardiovascular: Negative for chest pain, palpitations and leg swelling.  Gastrointestinal: Negative for abdominal pain, constipation, diarrhea, heartburn, nausea and vomiting.  Genitourinary: Negative for dysuria, frequency and urgency.  Musculoskeletal: Negative for back pain, myalgias and neck pain.  Skin: Negative for itching and rash.  Neurological: Negative for dizziness, tingling, seizures, loss of consciousness and headaches.  Endo/Heme/Allergies: Negative for polydipsia.  Psychiatric/Behavioral: Negative for depression. The patient is not nervous/anxious.   All other systems reviewed and are negative.   Objective:   BP 124/80 (BP Location: Left Arm, Patient Position: Sitting, Cuff Size: Large)   Pulse (!) 105   Temp 98.3 F (36.8 C) (Temporal)   Ht 5\' 5"  (1.651 m)   Wt 181 lb 4 oz (82.2 kg)   LMP 05/25/2019   SpO2 98%   BMI 30.16 kg/m  Body mass index is 30.16 kg/m.   General Appearance:    Alert, cooperative, no distress, appears stated age  Head:    Normocephalic, without obvious abnormality, atraumatic  Eyes:    PERRL, conjunctiva/corneas clear, EOM's intact, fundi    benign, both eyes  Ears:    Normal TM's and external ear canals, both ears  Nose:   Nares normal, septum midline, mucosa normal, no drainage    or sinus tenderness  Throat:   Lips, mucosa, and  tongue normal; teeth and gums normal  Neck:   Supple, symmetrical, trachea midline, no adenopathy;    thyroid:  no enlargement/tenderness/nodules; no carotid   bruit or JVD  Back:     Symmetric, no curvature, ROM normal, no CVA tenderness  Lungs:     Clear to auscultation bilaterally, respirations unlabored  Chest Wall:    No tenderness or deformity   Heart:    Regular rate and rhythm, S1 and S2 normal, no murmur, rub or gallop  Breast Exam:    Deferred  Abdomen:     Soft, non-tender, bowel sounds active all four quadrants,    no masses, no organomegaly  Genitalia:  Deferred  Extremities:   Extremities normal, atraumatic, no cyanosis or edema  Pulses:   2+ and symmetric all extremities  Skin:   Skin color, texture, turgor normal, no rashes or lesions Slight bruising to posterior second toe on R foot   Lymph nodes:   Cervical, supraclavicular, and axillary nodes normal  Neurologic:   CNII-XII intact, normal strength, sensation and reflexes    throughout     Assessment/Plan:   Anett was seen today for annual exam.  Diagnoses and all orders for this visit:  Routine physical examination Today patient counseled on age appropriate routine health concerns for screening and prevention, each reviewed and up to date or declined. Immunizations reviewed and up to date or declined. Labs ordered and reviewed. Risk factors for depression reviewed and negative. Hearing function and visual acuity are intact. ADLs screened and addressed as needed. Functional ability and level of safety reviewed and appropriate. Education, counseling and referrals performed based on assessed risks today. Patient provided with a copy of personalized plan for preventive services.  Essential hypertension Controlled. Continue Norvasc 5 mg daily. Follow-up in 1 year, sooner if concerns. -     CBC with Differential/Platelet -     Comprehensive metabolic panel  Encounter for lipid screening for cardiovascular disease -      Lipid panel  Anxiety Controlled. Continue buspar 5 mg BID. Follow-up in 1 year, sooner if concerns.  Foot cramps Unclear etiology. No red flags on exam -- pulses intact. Recommend following up with podiatry for further evaluation and management.  Obesity Continue to work on diet and exercise.  Well Adult Exam: Labs ordered: Yes. Patient counseling was done. See below for items discussed. Discussed the patient's BMI.  The BMI is not in the acceptable range; BMI management plan is completed Follow up in one year. Breast cancer screening: scheduled. Cervical cancer screening: scheduled.   Patient Counseling: [x]    Nutrition: Stressed importance of moderation in sodium/caffeine intake, saturated fat and cholesterol, caloric balance, sufficient intake of fresh fruits, vegetables, fiber, calcium, iron, and 1 mg of folate supplement per day (for females capable of pregnancy).  [x]    Stressed the importance of regular exercise.   [x]    Substance Abuse: Discussed cessation/primary prevention of tobacco, alcohol, or other drug use; driving or other dangerous activities under the influence; availability of treatment for abuse.   [x]    Injury prevention: Discussed safety belts, safety helmets, smoke detector, smoking near bedding or upholstery.   [x]    Sexuality: Discussed sexually transmitted diseases, partner selection, use of condoms, avoidance of unintended pregnancy  and contraceptive alternatives.  [x]    Dental health: Discussed importance of regular tooth brushing, flossing, and dental visits.  [x]    Health maintenance and immunizations reviewed. Please refer to Health maintenance section.   CMA or LPN served as scribe during this visit. History, Physical, and Plan performed by medical provider. The above documentation has been reviewed and is accurate and complete.   Inda Coke, PA-C [place]

## 2019-06-09 NOTE — Patient Instructions (Signed)
It was great to see you!  Please go to the lab for blood work.   Our office will call you with your results unless you have chosen to receive results via MyChart.  If your blood work is normal we will follow-up each year for physicals and as scheduled for chronic medical problems.  If anything is abnormal we will treat accordingly and get you in for a follow-up.  Take care,  Arnisha Andrea Gray    Health Maintenance, Female Adopting a healthy lifestyle and getting preventive care are important in promoting health and wellness. Ask your health care provider about:  The right schedule for you to have regular tests and exams.  Things you can do on your own to prevent diseases and keep yourself healthy. What should I know about diet, weight, and exercise? Eat a healthy diet   Eat a diet that includes plenty of vegetables, fruits, low-fat dairy products, and lean protein.  Do not eat a lot of foods that are high in solid fats, added sugars, or sodium. Maintain a healthy weight Body mass index (BMI) is used to identify weight problems. It estimates body fat based on height and weight. Your health care provider can help determine your BMI and help you achieve or maintain a healthy weight. Get regular exercise Get regular exercise. This is one of the most important things you can do for your health. Most adults should:  Exercise for at least 150 minutes each week. The exercise should increase your heart rate and make you sweat (moderate-intensity exercise).  Do strengthening exercises at least twice a week. This is in addition to the moderate-intensity exercise.  Spend less time sitting. Even light physical activity can be beneficial. Watch cholesterol and blood lipids Have your blood tested for lipids and cholesterol at 46 years of age, then have this test every 5 years. Have your cholesterol levels checked more often if:  Your lipid or cholesterol levels are high.  You are older than 46  years of age.  You are at high risk for heart disease. What should I know about cancer screening? Depending on your health history and family history, you may need to have cancer screening at various ages. This may include screening for:  Breast cancer.  Cervical cancer.  Colorectal cancer.  Skin cancer.  Lung cancer. What should I know about heart disease, diabetes, and high blood pressure? Blood pressure and heart disease  High blood pressure causes heart disease and increases the risk of stroke. This is more likely to develop in people who have high blood pressure readings, are of African descent, or are overweight.  Have your blood pressure checked: ? Every 3-5 years if you are 18-39 years of age. ? Every year if you are 40 years old or older. Diabetes Have regular diabetes screenings. This checks your fasting blood sugar level. Have the screening done:  Once every three years after age 40 if you are at a normal weight and have a low risk for diabetes.  More often and at a younger age if you are overweight or have a high risk for diabetes. What should I know about preventing infection? Hepatitis B If you have a higher risk for hepatitis B, you should be screened for this virus. Talk with your health care provider to find out if you are at risk for hepatitis B infection. Hepatitis C Testing is recommended for:  Everyone born from 1945 through 1965.  Anyone with known risk factors for hepatitis C. Sexually   transmitted infections (STIs)  Get screened for STIs, including gonorrhea and chlamydia, if: ? You are sexually active and are younger than 46 years of age. ? You are older than 46 years of age and your health care provider tells you that you are at risk for this type of infection. ? Your sexual activity has changed since you were last screened, and you are at increased risk for chlamydia or gonorrhea. Ask your health care provider if you are at risk.  Ask your health  care provider about whether you are at high risk for HIV. Your health care provider may recommend a prescription medicine to help prevent HIV infection. If you choose to take medicine to prevent HIV, you should first get tested for HIV. You should then be tested every 3 months for as long as you are taking the medicine. Pregnancy  If you are about to stop having your period (premenopausal) and you may become pregnant, seek counseling before you get pregnant.  Take 400 to 800 micrograms (mcg) of folic acid every day if you become pregnant.  Ask for birth control (contraception) if you want to prevent pregnancy. Osteoporosis and menopause Osteoporosis is a disease in which the bones lose minerals and strength with aging. This can result in bone fractures. If you are 65 years old or older, or if you are at risk for osteoporosis and fractures, ask your health care provider if you should:  Be screened for bone loss.  Take a calcium or vitamin D supplement to lower your risk of fractures.  Be given hormone replacement therapy (HRT) to treat symptoms of menopause. Follow these instructions at home: Lifestyle  Do not use any products that contain nicotine or tobacco, such as cigarettes, e-cigarettes, and chewing tobacco. If you need help quitting, ask your health care provider.  Do not use street drugs.  Do not share needles.  Ask your health care provider for help if you need support or information about quitting drugs. Alcohol use  Do not drink alcohol if: ? Your health care provider tells you not to drink. ? You are pregnant, may be pregnant, or are planning to become pregnant.  If you drink alcohol: ? Limit how much you use to 0-1 drink a day. ? Limit intake if you are breastfeeding.  Be aware of how much alcohol is in your drink. In the [place] one drink equals one 12 oz bottle of beer (355 mL), one 5 oz glass of wine (148 mL), or one 1 oz glass of hard liquor (44 mL). General  instructions  Schedule regular health, dental, and eye exams.  Stay current with your vaccines.  Tell your health care provider if: ? You often feel depressed. ? You have ever been abused or do not feel safe at home. Summary  Adopting a healthy lifestyle and getting preventive care are important in promoting health and wellness.  Follow your health care provider's instructions about healthy diet, exercising, and getting tested or screened for diseases.  Follow your health care provider's instructions on monitoring your cholesterol and blood pressure. This information is not intended to replace advice given to you by your health care provider. Make sure you discuss any questions you have with your health care provider. Document Revised: 01/23/2018 Document Reviewed: 01/23/2018 Elsevier Patient Education  2020 Elsevier Inc.  

## 2019-06-15 ENCOUNTER — Encounter: Payer: Self-pay | Admitting: Physician Assistant

## 2019-06-16 MED ORDER — FLUTICASONE PROPIONATE 50 MCG/ACT NA SUSP: 2.0000 | Freq: Every day | NASAL | 6 refills | Status: DC

## 2019-06-16 MED FILL — FLUTICASONE PROP 50 MCG SPR: 50 | 30 days supply | Qty: 16 | Fill #0

## 2019-06-18 MED FILL — NORLYDA 0.35 MG TABS: 0.35 | 84 days supply | Qty: 84 | Fill #4

## 2019-06-30 ENCOUNTER — Ambulatory Visit: Payer: 59

## 2019-07-08 DIAGNOSIS — L821 Other seborrheic keratosis: Secondary | ICD-10-CM | POA: Diagnosis not present

## 2019-07-08 DIAGNOSIS — L918 Other hypertrophic disorders of the skin: Secondary | ICD-10-CM | POA: Diagnosis not present

## 2019-07-08 DIAGNOSIS — D2272 Melanocytic nevi of left lower limb, including hip: Secondary | ICD-10-CM | POA: Diagnosis not present

## 2019-07-08 DIAGNOSIS — D225 Melanocytic nevi of trunk: Secondary | ICD-10-CM | POA: Diagnosis not present

## 2019-07-16 ENCOUNTER — Other Ambulatory Visit: Payer: Self-pay | Admitting: Physician Assistant

## 2019-07-16 MED FILL — AMLODIPINE BESYLATE 5 MG TA: 5 | 90 days supply | Qty: 90 | Fill #0

## 2019-08-05 ENCOUNTER — Encounter: Payer: Self-pay | Admitting: Physician Assistant

## 2019-08-06 ENCOUNTER — Other Ambulatory Visit: Payer: Self-pay | Admitting: Physician Assistant

## 2019-08-06 MED ORDER — BUSPIRONE HCL 5 MG PO TABS: 5.0000 mg | ORAL_TABLET | Freq: Two times a day (BID) | ORAL | 1 refills | Status: DC

## 2019-08-06 MED FILL — busPIRone HCL 5 MG TABS: 5 | 45 days supply | Qty: 90 | Fill #1

## 2019-08-12 ENCOUNTER — Encounter: Payer: Self-pay | Admitting: Physician Assistant

## 2019-08-12 NOTE — Telephone Encounter (Signed)
Pleas advise.

## 2019-08-19 ENCOUNTER — Other Ambulatory Visit: Payer: Self-pay

## 2019-08-19 ENCOUNTER — Other Ambulatory Visit (INDEPENDENT_AMBULATORY_CARE_PROVIDER_SITE_OTHER): Payer: 59

## 2019-08-19 DIAGNOSIS — R7989 Other specified abnormal findings of blood chemistry: Secondary | ICD-10-CM

## 2019-08-19 LAB — COMPREHENSIVE METABOLIC PANEL
ALT: 119 U/L — ABNORMAL HIGH (ref 0–35)
AST: 89 U/L — ABNORMAL HIGH (ref 0–37)
Albumin: 4.8 g/dL (ref 3.5–5.2)
Alkaline Phosphatase: 47 U/L (ref 39–117)
BUN: 14 mg/dL (ref 6–23)
CO2: 25 mEq/L (ref 19–32)
Calcium: 9.8 mg/dL (ref 8.4–10.5)
Chloride: 98 mEq/L (ref 96–112)
Creatinine, Ser: 0.72 mg/dL (ref 0.40–1.20)
GFR: 87.29 mL/min (ref >60.00)
Glucose, Bld: 74 mg/dL (ref 70–99)
Potassium: 4.1 mEq/L (ref 3.5–5.1)
Sodium: 139 mEq/L (ref 135–145)
Total Bilirubin: 1.4 mg/dL — ABNORMAL HIGH (ref 0.2–1.2)
Total Protein: 7 g/dL (ref 6.0–8.3)

## 2019-08-20 ENCOUNTER — Other Ambulatory Visit: Payer: Self-pay | Admitting: Physician Assistant

## 2019-08-20 ENCOUNTER — Encounter: Payer: Self-pay | Admitting: Physician Assistant

## 2019-08-20 DIAGNOSIS — R7989 Other specified abnormal findings of blood chemistry: Secondary | ICD-10-CM

## 2019-09-01 MED FILL — PANTOPRAZOLE SOD DR 20 MG T: 20 | 90 days supply | Qty: 90 | Fill #4

## 2019-09-03 ENCOUNTER — Encounter: Payer: Self-pay | Admitting: Physician Assistant

## 2019-09-03 DIAGNOSIS — Z3041 Encounter for surveillance of contraceptive pills: Secondary | ICD-10-CM

## 2019-09-04 MED ORDER — NORETHINDRONE 0.35 MG PO TABS: 1.0000 | ORAL_TABLET | Freq: Every evening | ORAL | 0 refills | Status: DC

## 2019-09-04 MED ORDER — PANTOPRAZOLE SODIUM 20 MG PO TBEC: 20.0000 mg | DELAYED_RELEASE_TABLET | Freq: Every day | ORAL | 0 refills | Status: DC

## 2019-09-04 MED ORDER — BUSPIRONE HCL 5 MG PO TABS: 5.0000 mg | ORAL_TABLET | Freq: Two times a day (BID) | ORAL | 0 refills | Status: AC

## 2019-09-04 MED ORDER — AMLODIPINE BESYLATE 5 MG PO TABS: 5.0000 mg | ORAL_TABLET | Freq: Every day | ORAL | 0 refills | Status: DC

## 2019-09-04 NOTE — Addendum Note (Signed)
Addended bySerita Sheller on: 09/04/2019 02:21 PM   Modules accepted: Orders

## 2019-09-08 DIAGNOSIS — H524 Presbyopia: Secondary | ICD-10-CM | POA: Diagnosis not present

## 2019-09-08 DIAGNOSIS — H52223 Regular astigmatism, bilateral: Secondary | ICD-10-CM | POA: Diagnosis not present

## 2019-09-25 ENCOUNTER — Other Ambulatory Visit: Payer: Self-pay | Admitting: Physician Assistant

## 2019-09-25 DIAGNOSIS — Z3041 Encounter for surveillance of contraceptive pills: Secondary | ICD-10-CM

## 2019-10-13 MED FILL — busPIRone HCL 5 MG TABS: 5 | 45 days supply | Qty: 90 | Fill #2

## 2019-10-30 ENCOUNTER — Other Ambulatory Visit (HOSPITAL_COMMUNITY): Payer: Self-pay | Admitting: Obstetrics and Gynecology

## 2019-10-30 MED FILL — NORLYDA 0.35 MG TABS: 0.35 | 84 days supply | Qty: 84 | Fill #0

## 2019-11-23 ENCOUNTER — Encounter: Payer: Self-pay | Admitting: Physician Assistant

## 2019-12-03 ENCOUNTER — Other Ambulatory Visit (HOSPITAL_COMMUNITY): Payer: Self-pay | Admitting: Obstetrics and Gynecology

## 2019-12-03 MED FILL — PANTOPRAZOLE SOD DR 20 MG T: 20 | 90 days supply | Qty: 90 | Fill #0

## 2019-12-18 MED FILL — busPIRone HCL 5 MG TABS: 5 | 45 days supply | Qty: 90 | Fill #3

## 2020-01-01 ENCOUNTER — Other Ambulatory Visit: Payer: Self-pay | Admitting: Physician Assistant

## 2020-01-01 MED FILL — AMLODIPINE BESYLATE 5 MG TA: 5 | 90 days supply | Qty: 90 | Fill #0

## 2020-01-21 ENCOUNTER — Other Ambulatory Visit: Payer: Self-pay

## 2020-01-21 ENCOUNTER — Other Ambulatory Visit: Payer: Self-pay | Admitting: Physician Assistant

## 2020-01-21 ENCOUNTER — Encounter: Payer: Self-pay | Admitting: Physician Assistant

## 2020-01-21 ENCOUNTER — Ambulatory Visit: Payer: 59 | Admitting: Physician Assistant

## 2020-01-21 VITALS — BP 128/80 | HR 99 | Temp 98.1°F | Ht 65.0 in | Wt 168.4 lb

## 2020-01-21 DIAGNOSIS — J310 Chronic rhinitis: Secondary | ICD-10-CM

## 2020-01-21 DIAGNOSIS — L659 Nonscarring hair loss, unspecified: Secondary | ICD-10-CM

## 2020-01-21 MED ORDER — FLUTICASONE PROPIONATE 50 MCG/ACT NA SUSP: 2.0000 | Freq: Every day | NASAL | 6 refills | Status: DC

## 2020-01-21 MED FILL — FLUTICASONE PROP 50 MCG SPR: 50 | 30 days supply | Qty: 16 | Fill #0

## 2020-01-21 NOTE — Patient Instructions (Signed)
It was great to see you!  Please make an appointment with the lab on your way out. I would like for you to return for lab work within 1-2 weeks. Do not need to be fasting, but please hold your biotin for at least 72 hours prior to lab draw.  Take care,  Inda Coke PA-C

## 2020-01-21 NOTE — Progress Notes (Signed)
Andrea Gray is a 44 y.o. female here for a new problem.  I acted as a Education administrator for [employer], PA-C Anselmo Pickler, LPN   History of Present Illness:   Chief Complaint  Patient presents with  . Alopecia    HPI   Hair loss Pt c/o of generalized hair thinning x the past 3 months. She denies having any new hair products or prior issues with hair loss. She has started taking biotin and collagen for her symptoms. She has had a very stressful September and October and is not sure if that has contributed to her symptoms.  She has had intentional weight loss of >30 lb doing keto diet over the past year. Denies significant changes to her nails or any concerns for pregnancy.  Wt Readings from Last 4 Encounters:  01/21/20 168 lb 6.1 oz (76.4 kg)  06/09/19 181 lb 4 oz (82.2 kg)  07/30/17 188 lb (85.3 kg)  05/20/17 185 lb (83.9 kg)   Patient's last menstrual period was 12/19/2019.  Rhinitis Uses flonase regularly and this helps her symptoms. She would like refill today. Denies recent URI or any concerning symptoms.  Past Medical History:  Diagnosis Date  . Family history of adverse reaction to anesthesia    Father has PONV  . Fibroadenoma of right breast   . GERD (gastroesophageal reflux disease)   . Headache   . Kidney stones 2007  . PVC's (premature ventricular contractions)    Hx: of  . Umbilical hernia      Social History   Tobacco Use  . Smoking status: Never Smoker  . Smokeless tobacco: Never Used  Vaping Use  . Vaping Use: Never used  Substance Use Topics  . Alcohol use: Yes    Comment: daily wine 1-2 glasses  . Drug use: No    Past Surgical History:  Procedure Laterality Date  . BREAST SURGERY  2017   removal of Fibroadenoma  . CESAREAN SECTION    . CHOLECYSTECTOMY    . CYST EXCISION     from lip as a toddler  . DILATION AND CURETTAGE OF UTERUS    . LASIK    . LITHOTRIPSY    . MASS EXCISION Right 10/28/2015   Procedure: EXCISION OF RIGHT BREAST  FIBROADENOMA;  Surgeon: Jackolyn Confer, MD;  Location: [place];  Service: General;  Laterality: Right;  . WISDOM TOOTH EXTRACTION      Family History  Problem Relation Age of Onset  . Hypertension Mother   . COPD Mother   . Depression Mother   . Diabetes Mother   . Hypertension Father   . Alcohol abuse Brother   . COPD Brother   . Depression Brother   . Drug abuse Brother   . Hypertension Brother   . Learning disabilities Brother   . Mental illness Brother   . Arthritis Maternal Grandmother   . Asthma Maternal Grandmother   . Hypertension Maternal Grandmother   . Alcohol abuse Maternal Grandfather   . Hypertension Maternal Grandfather   . Arthritis Paternal Grandmother   . Cancer Paternal Grandmother        unknown type  . Early death Paternal Grandmother   . Hearing loss Paternal Grandfather   . Breast cancer Other     Allergies  Allergen Reactions  . Penicillins     Has patient had a PCN reaction causing immediate rash, facial/tongue/throat swelling, SOB or lightheadedness with hypotension: Yes Has patient had a PCN reaction causing severe rash involving  mucus membranes or skin necrosis: No Has patient had a PCN reaction that required hospitalization Yes Has patient had a PCN reaction occurring within the last 10 years: No If all of the above answers are "NO", then may proceed with Cephalosporin use.      Current Medications:   Current Outpatient Medications:  .  amLODipine (NORVASC) 5 MG tablet, TAKE 1 TABLET BY MOUTH DAILY (Patient taking differently: Take 2.5 mg by mouth daily. ), Disp: 90 tablet, Rfl: 0 .  busPIRone (BUSPAR) 5 MG tablet, Take 5 mg by mouth 2 (two) times daily., Disp: , Rfl:  .  fluticasone (FLONASE) 50 MCG/ACT nasal spray, Place 2 sprays into both nostrils daily., Disp: 16 g, Rfl: 6 .  ibuprofen (ADVIL,MOTRIN) 200 MG tablet, Take 400 mg by mouth every 6 (six) hours as needed for mild pain., Disp: , Rfl:  .  norethindrone (MICRONOR) 0.35 MG  tablet, TAKE 1 TABLET (0.35 MG TOTAL) BY MOUTH EVERY EVENING., Disp: 28 tablet, Rfl: 10 .  pantoprazole (PROTONIX) 20 MG tablet, Take 1 tablet (20 mg total) by mouth daily., Disp: 2 tablet, Rfl: 0   Review of Systems:   ROS Negative unless otherwise specified per HPI.  Vitals:   Vitals:   01/21/20 0931  BP: 128/80  Pulse: 99  Temp: 98.1 F (36.7 C)  TempSrc: Temporal  SpO2: 99%  Weight: 168 lb 6.1 oz (76.4 kg)  Height: 5\' 5"  (1.651 m)     Body mass index is 28.02 kg/m.  Physical Exam:   Physical Exam Vitals and nursing note reviewed.  Constitutional:      General: She is not in acute distress.    Appearance: She is well-developed. She is not ill-appearing or toxic-appearing.  Cardiovascular:     Rate and Rhythm: Normal rate and regular rhythm.     Pulses: Normal pulses.     Heart sounds: Normal heart sounds, S1 normal and S2 normal.     Comments: No LE edema Pulmonary:     Effort: Pulmonary effort is normal.     Breath sounds: Normal breath sounds.  Skin:    General: Skin is warm and dry.     Comments: No obvious patches of hair loss, generalized thinning  Neurological:     Mental Status: She is alert.     GCS: GCS eye subscore is 4. GCS verbal subscore is 5. GCS motor subscore is 6.  Psychiatric:        Speech: Speech normal.        Behavior: Behavior normal. Behavior is cooperative.     Assessment and Plan:   Franchelle was seen today for alopecia.  Diagnoses and all orders for this visit:  Hair thinning Will have patient hold her biotin and check labs later this week to r/o organic cause of symptoms. Discussed that hair thinning can be multifactorial, related to stress and hormones. Further evaluation and intervention based on clinical symptoms and lab results. -     CBC with Differential/Platelet; Future -     Comprehensive metabolic panel; Future -     TSH; Future -     Ferritin; Future -     T4, free; Future  Rhinitis, unspecified type No red  flags. Refill flonase per orders.  Other orders -     fluticasone (FLONASE) 50 MCG/ACT nasal spray; Place 2 sprays into both nostrils daily.  CMA or LPN served as scribe during this visit. History, Physical, and Plan performed by medical provider. The above documentation  has been reviewed and is accurate and complete.  Inda Coke, PA-C

## 2020-01-26 MED FILL — NORLYDA 0.35 MG TABS: 0.35 | 84 days supply | Qty: 84 | Fill #1

## 2020-01-27 ENCOUNTER — Other Ambulatory Visit: Payer: 59

## 2020-01-27 DIAGNOSIS — L659 Nonscarring hair loss, unspecified: Secondary | ICD-10-CM

## 2020-01-28 ENCOUNTER — Ambulatory Visit (INDEPENDENT_AMBULATORY_CARE_PROVIDER_SITE_OTHER): Payer: 59 | Admitting: Podiatry

## 2020-01-28 ENCOUNTER — Other Ambulatory Visit: Payer: Self-pay

## 2020-01-28 ENCOUNTER — Other Ambulatory Visit: Payer: Self-pay | Admitting: Podiatry

## 2020-01-28 ENCOUNTER — Ambulatory Visit (INDEPENDENT_AMBULATORY_CARE_PROVIDER_SITE_OTHER): Payer: 59

## 2020-01-28 DIAGNOSIS — M67471 Ganglion, right ankle and foot: Secondary | ICD-10-CM

## 2020-01-28 DIAGNOSIS — M79671 Pain in right foot: Secondary | ICD-10-CM

## 2020-01-28 LAB — COMPREHENSIVE METABOLIC PANEL
AG Ratio: 2.1 (calc) (ref 1.0–2.5)
ALT: 25 U/L (ref 6–29)
AST: 24 U/L (ref 10–35)
Albumin: 4.5 g/dL (ref 3.6–5.1)
Alkaline phosphatase (APISO): 42 U/L (ref 31–125)
BUN: 24 mg/dL (ref 7–25)
CO2: 28 mmol/L (ref 20–32)
Calcium: 9.8 mg/dL (ref 8.6–10.2)
Chloride: 102 mmol/L (ref 98–110)
Creat: 0.73 mg/dL (ref 0.50–1.10)
Globulin: 2.1 g/dL (calc) (ref 1.9–3.7)
Glucose, Bld: 107 mg/dL — ABNORMAL HIGH (ref 65–99)
Potassium: 4.1 mmol/L (ref 3.5–5.3)
Sodium: 138 mmol/L (ref 135–146)
Total Bilirubin: 0.8 mg/dL (ref 0.2–1.2)
Total Protein: 6.6 g/dL (ref 6.1–8.1)

## 2020-01-28 LAB — CBC WITH DIFFERENTIAL/PLATELET
Absolute Monocytes: 526 cells/uL (ref 200–950)
Basophils Absolute: 58 cells/uL (ref 0–200)
Basophils Relative: 0.8 %
Eosinophils Absolute: 281 cells/uL (ref 15–500)
Eosinophils Relative: 3.9 %
HCT: 38.1 % (ref 35.0–45.0)
Hemoglobin: 13 g/dL (ref 11.7–15.5)
Lymphs Abs: 2297 cells/uL (ref 850–3900)
MCH: 34.1 pg — ABNORMAL HIGH (ref 27.0–33.0)
MCHC: 34.1 g/dL (ref 32.0–36.0)
MCV: 100 fL (ref 80.0–100.0)
MPV: 9.9 fL (ref 7.5–12.5)
Monocytes Relative: 7.3 %
Neutro Abs: 4039 cells/uL (ref 1500–7800)
Neutrophils Relative %: 56.1 %
Platelets: 278 10*3/uL (ref 140–400)
RBC: 3.81 10*6/uL (ref 3.80–5.10)
RDW: 11.2 % (ref 11.0–15.0)
Total Lymphocyte: 31.9 %
WBC: 7.2 10*3/uL (ref 3.8–10.8)

## 2020-01-28 LAB — TSH: TSH: 2.14 mIU/L

## 2020-01-28 LAB — FERRITIN: Ferritin: 226 ng/mL (ref 16–232)

## 2020-01-28 LAB — T4, FREE: Free T4: 1.1 ng/dL (ref 0.8–1.8)

## 2020-02-15 NOTE — Progress Notes (Signed)
   HPI: 45 y.o. female presenting today as an established patient for new complaint today regarding cystic lesion that developed to the dorsum of the right foot over the past 2 weeks.  Patient states it is slowly increased in size and become very uncomfortable.  She believes it is a ganglion cyst.  She presents for further treatment evaluation.  She is currently not dieting for treatment.  Past Medical History:  Diagnosis Date  . Family history of adverse reaction to anesthesia    Father has PONV  . Fibroadenoma of right breast   . GERD (gastroesophageal reflux disease)   . Headache   . Kidney stones 2007  . PVC's (premature ventricular contractions)    Hx: of  . Umbilical hernia      Physical Exam: General: The patient is alert and oriented x3 in no acute distress.  Dermatology: Skin is warm, dry and supple bilateral lower extremities. Negative for open lesions or macerations.  Vascular: Palpable pedal pulses bilaterally. No edema or erythema noted. Capillary refill within normal limits.  Neurological: Epicritic and protective threshold grossly intact bilaterally.   Musculoskeletal Exam: Range of motion within normal limits to all pedal and ankle joints bilateral. Muscle strength 5/5 in all groups bilateral.  Large fluctuant not adhered lesion noted to the dorsal aspect of the right foot findings consistent with a ganglion cyst  Radiographic Exam:  Normal osseous mineralization. Joint spaces preserved. No fracture/dislocation/boney destruction.    Assessment: 1.  Ganglion cyst right dorsal foot   Plan of Care:  1. Patient evaluated. X-Rays reviewed.  2.  Explained to the patient would likely be in her best interest to have the cyst drained and aspirated.  The patient agrees and would like to have it aspirated and removed today. 3.  Prior to draining of the cyst, the area was prepped in aseptic manner and 3 mL of 2% lidocaine plain was infiltrated in a V-block fashion around the  cyst 4.  18-gauge needle on a syringe was utilized to penetrate the ganglion cyst and approximately 2.5 mL of viscous honey colored fluid was drained.  Compressive dressing was applied. 5.  Compression ankle sleeve was dispensed.  Recommend wearing daily x3 weeks. 6.  Return to clinic as needed      Felecia Shelling, DPM [facility]  Dr. Felecia Shelling, DPM    [address] [place], [place] [address]                Office [phone]  Fax [phone]

## 2020-02-18 MED FILL — busPIRone HCL 5 MG TABS: 5 | 45 days supply | Qty: 90 | Fill #4

## 2020-03-03 MED FILL — PANTOPRAZOLE SOD DR 20 MG T: 20 | 90 days supply | Qty: 90 | Fill #1

## 2020-04-08 ENCOUNTER — Encounter: Payer: Self-pay | Admitting: Physician Assistant

## 2020-04-09 ENCOUNTER — Telehealth (INDEPENDENT_AMBULATORY_CARE_PROVIDER_SITE_OTHER): Payer: 59 | Admitting: Family Medicine

## 2020-04-09 ENCOUNTER — Other Ambulatory Visit: Payer: Self-pay | Admitting: Family Medicine

## 2020-04-09 DIAGNOSIS — R519 Headache, unspecified: Secondary | ICD-10-CM

## 2020-04-09 DIAGNOSIS — R0981 Nasal congestion: Secondary | ICD-10-CM

## 2020-04-09 DIAGNOSIS — U071 COVID-19: Secondary | ICD-10-CM

## 2020-04-09 MED ORDER — DOXYCYCLINE HYCLATE 100 MG PO TABS: 100.0000 mg | ORAL_TABLET | Freq: Two times a day (BID) | ORAL | 0 refills | Status: DC

## 2020-04-09 NOTE — Progress Notes (Signed)
Virtual Visit via Video Note  I connected with Andrea Gray  on 04/09/20 at  4:00 PM EST by a video enabled telemedicine application and verified that I am speaking with the correct person using two identifiers.  Location patient: home, [place] Location provider:work or home office Persons participating in the virtual visit: patient, provider  I discussed the limitations of evaluation and management by telemedicine and the availability of in person appointments. The patient expressed understanding and agreed to proceed.   HPI:  Acute telemedicine visit for sinus issues: -Onset: 03/27/20 - then found out had covid the next day -Symptoms include: had flu like symptoms initially - that improved, but the sinus issues have lingered and now are worsening the last few days - thick nasal congestion, pain in maxillary sinus/upper teeth worse with leaning forward -Denies: CP, SOB, NVD, inability to eat/drink/get out of bed -Has tried:flonase -Pertinent medication allergies: penicillin -COVID-19 vaccine status: fully vaccinated + booster  ROS: See pertinent positives and negatives per HPI.  Past Medical History:  Diagnosis Date  . Family history of adverse reaction to anesthesia    Father has PONV  . Fibroadenoma of right breast   . GERD (gastroesophageal reflux disease)   . Headache   . Kidney stones 2007  . PVC's (premature ventricular contractions)    Hx: of  . Umbilical hernia     Past Surgical History:  Procedure Laterality Date  . BREAST SURGERY  2017   removal of Fibroadenoma  . CESAREAN SECTION    . CHOLECYSTECTOMY    . CYST EXCISION     from lip as a toddler  . DILATION AND CURETTAGE OF UTERUS    . LASIK    . LITHOTRIPSY    . MASS EXCISION Right 10/28/2015   Procedure: EXCISION OF RIGHT BREAST FIBROADENOMA;  Surgeon: Jackolyn Confer, MD;  Location: [place];  Service: General;  Laterality: Right;  . WISDOM TOOTH EXTRACTION       Current Outpatient Medications:  .  doxycycline  (VIBRA-TABS) 100 MG tablet, Take 1 tablet (100 mg total) by mouth 2 (two) times daily., Disp: 20 tablet, Rfl: 0 .  amLODipine (NORVASC) 5 MG tablet, TAKE 1 TABLET BY MOUTH DAILY (Patient taking differently: Take 2.5 mg by mouth daily.), Disp: 90 tablet, Rfl: 0 .  busPIRone (BUSPAR) 5 MG tablet, Take 5 mg by mouth 2 (two) times daily., Disp: , Rfl:  .  fluticasone (FLONASE) 50 MCG/ACT nasal spray, Place 2 sprays into both nostrils daily., Disp: 16 g, Rfl: 6 .  ibuprofen (ADVIL,MOTRIN) 200 MG tablet, Take 400 mg by mouth every 6 (six) hours as needed for mild pain., Disp: , Rfl:  .  norethindrone (MICRONOR) 0.35 MG tablet, TAKE 1 TABLET (0.35 MG TOTAL) BY MOUTH EVERY EVENING., Disp: 28 tablet, Rfl: 10 .  pantoprazole (PROTONIX) 20 MG tablet, Take 1 tablet (20 mg total) by mouth daily., Disp: 2 tablet, Rfl: 0 .  PFIZER-BIONTECH COVID-19 VACC 30 MCG/0.3ML injection, , Disp: , Rfl:   EXAM:  VITALS per patient if applicable:  GENERAL: alert, oriented, appears well and in no acute distress  HEENT: atraumatic, conjunttiva clear, no obvious abnormalities on inspection of external nose and ears  NECK: normal movements of the head and neck  LUNGS: on inspection no signs of respiratory distress, breathing rate appears normal, no obvious gross SOB, gasping or wheezing  CV: no obvious cyanosis  MS: moves all visible extremities without noticeable abnormality  PSYCH/NEURO: pleasant and cooperative, no obvious depression or anxiety,  speech and thought processing grossly intact  ASSESSMENT AND PLAN:  Discussed the following assessment and plan:  Facial pain  COVID-19  Nasal congestion  -we discussed possible serious and likely etiologies, options for evaluation and workup, limitations of telemedicine visit vs in person visit, treatment, treatment risks and precautions. Pt prefers to treat via telemedicine empirically rather than in person at this moment.  Possible post Covid/viral symptoms versus  developing sinusitis given duration of symptoms with worsening, versus other.  Opted for nasal saline twice daily, short course of nasal decongestant and doxycycline 100 mg twice daily for 10 days per her preference after discussion of risk/benefits with antibiotics. Work/School slipped offered:  declined Scheduled follow up with PCP offered: She agrees to follow-up if needed through her primary care office. Advised to seek prompt follow-up video visit or in person care if worsening, new symptoms arise, or if is not improving with treatment. Did let this patient know that I only do telemedicine on Tuesdays and Thursdays for [place]. Advised to schedule follow up visit with PCP or [facility] if any further questions or concerns to avoid delays in care.   I discussed the assessment and treatment plan with the patient. The patient was provided an opportunity to ask questions and all were answered. The patient agreed with the plan and demonstrated an understanding of the instructions.     Lucretia Kern, DO

## 2020-04-09 NOTE — Patient Instructions (Signed)
-  I sent the medication(s) we discussed to your pharmacy: Meds ordered this encounter  Medications  . doxycycline (VIBRA-TABS) 100 MG tablet    Sig: Take 1 tablet (100 mg total) by mouth 2 (two) times daily.    Dispense:  20 tablet    Refill:  0   Can also try nasal saline twice daily and a short 3-day course of nasal decongestant, Afrin.  These are available over-the-counter.  I hope you are feeling better soon!  Seek prompt follow-up video visit or in person care if your symptoms worsen, new concerns arise or you are not improving with treatment.  It was nice to meet you today. I help [facility] out with telemedicine visits on Tuesdays and Thursdays and am available for visits on those days. If you have any concerns or questions following this visit please schedule a follow up visit with your Primary Care doctor or seek care at a local urgent care clinic to avoid delays in care.

## 2020-04-12 MED FILL — DOXYCYCLINE HYCLATE 100 MG: 100 | 10 days supply | Qty: 20 | Fill #0

## 2020-04-19 MED FILL — NORLYDA 0.35 MG TABS: 0.35 | 84 days supply | Qty: 84 | Fill #2

## 2020-04-20 ENCOUNTER — Encounter: Payer: Self-pay | Admitting: Physician Assistant

## 2020-04-21 ENCOUNTER — Other Ambulatory Visit: Payer: Self-pay | Admitting: Physician Assistant

## 2020-04-21 MED ORDER — OSELTAMIVIR PHOSPHATE 75 MG PO CAPS: 75.0000 mg | ORAL_CAPSULE | Freq: Two times a day (BID) | ORAL | 0 refills | Status: DC

## 2020-04-21 MED FILL — OSELTAMIVIR PHOSPHATE 75 MG: 75 | 5 days supply | Qty: 10 | Fill #0

## 2020-05-03 DIAGNOSIS — L650 Telogen effluvium: Secondary | ICD-10-CM | POA: Diagnosis not present

## 2020-05-03 DIAGNOSIS — L814 Other melanin hyperpigmentation: Secondary | ICD-10-CM | POA: Diagnosis not present

## 2020-05-03 DIAGNOSIS — D225 Melanocytic nevi of trunk: Secondary | ICD-10-CM | POA: Diagnosis not present

## 2020-05-03 DIAGNOSIS — L821 Other seborrheic keratosis: Secondary | ICD-10-CM | POA: Diagnosis not present

## 2020-05-15 ENCOUNTER — Other Ambulatory Visit (HOSPITAL_COMMUNITY): Payer: Self-pay

## 2020-06-14 ENCOUNTER — Encounter: Payer: Self-pay | Admitting: Physician Assistant

## 2020-06-14 ENCOUNTER — Other Ambulatory Visit (HOSPITAL_COMMUNITY): Payer: Self-pay

## 2020-06-14 MED ORDER — AMLODIPINE BESYLATE 2.5 MG PO TABS
2.5000 mg | ORAL_TABLET | Freq: Every day | ORAL | 1 refills | Status: DC
  Filled 2020-06-14: qty 90, 90d supply, fill #0
  Filled 2020-09-29: qty 90, 90d supply, fill #1

## 2020-06-14 MED FILL — Norethindrone Tab 0.35 MG: ORAL | 84 days supply | Qty: 84 | Fill #0 | Status: CN

## 2020-06-14 MED FILL — Pantoprazole Sodium EC Tab 20 MG (Base Equiv): ORAL | 90 days supply | Qty: 90 | Fill #0 | Status: AC

## 2020-06-14 NOTE — Telephone Encounter (Signed)
Andrea Gray, pt is only taking Amlodipine 2.5 mg for blood pressure. Okay to send in new Rx?

## 2020-07-07 ENCOUNTER — Encounter: Payer: Self-pay | Admitting: Physician Assistant

## 2020-07-10 ENCOUNTER — Encounter: Payer: Self-pay | Admitting: Physician Assistant

## 2020-07-10 ENCOUNTER — Other Ambulatory Visit (HOSPITAL_COMMUNITY): Payer: Self-pay

## 2020-07-10 DIAGNOSIS — Z3041 Encounter for surveillance of contraceptive pills: Secondary | ICD-10-CM

## 2020-07-10 MED FILL — Norethindrone Tab 0.35 MG: ORAL | 84 days supply | Qty: 84 | Fill #0 | Status: CN

## 2020-07-13 MED ORDER — NORETHINDRONE 0.35 MG PO TABS: 1.0000 | ORAL_TABLET | Freq: Every evening | ORAL | 0 refills | Status: DC

## 2020-08-09 ENCOUNTER — Other Ambulatory Visit (HOSPITAL_COMMUNITY): Payer: Self-pay

## 2020-08-09 MED ORDER — NORETHINDRONE 0.35 MG PO TABS
1.0000 | ORAL_TABLET | Freq: Every day | ORAL | 0 refills | Status: DC
  Filled 2020-08-09: qty 84, 84d supply, fill #0

## 2020-08-26 ENCOUNTER — Other Ambulatory Visit: Payer: Self-pay | Admitting: Physician Assistant

## 2020-08-26 ENCOUNTER — Other Ambulatory Visit (HOSPITAL_COMMUNITY): Payer: Self-pay

## 2020-08-30 ENCOUNTER — Other Ambulatory Visit (HOSPITAL_COMMUNITY): Payer: Self-pay

## 2020-08-30 ENCOUNTER — Other Ambulatory Visit: Payer: Self-pay | Admitting: Physician Assistant

## 2020-08-31 ENCOUNTER — Encounter: Payer: Self-pay | Admitting: Physician Assistant

## 2020-08-31 ENCOUNTER — Other Ambulatory Visit (HOSPITAL_COMMUNITY): Payer: Self-pay

## 2020-08-31 MED ORDER — BUSPIRONE HCL 5 MG PO TABS
5.0000 mg | ORAL_TABLET | Freq: Two times a day (BID) | ORAL | 1 refills | Status: DC
  Filled 2020-08-31: qty 60, 30d supply, fill #0
  Filled 2020-09-29: qty 60, 30d supply, fill #1

## 2020-09-01 ENCOUNTER — Other Ambulatory Visit (HOSPITAL_COMMUNITY): Payer: Self-pay

## 2020-09-01 DIAGNOSIS — Z124 Encounter for screening for malignant neoplasm of cervix: Secondary | ICD-10-CM | POA: Diagnosis not present

## 2020-09-01 DIAGNOSIS — Z01419 Encounter for gynecological examination (general) (routine) without abnormal findings: Secondary | ICD-10-CM | POA: Diagnosis not present

## 2020-09-01 LAB — RESULTS CONSOLE HPV: CHL HPV: NEGATIVE

## 2020-09-01 MED ORDER — BUSPIRONE HCL 5 MG PO TABS
5.0000 mg | ORAL_TABLET | Freq: Two times a day (BID) | ORAL | 4 refills | Status: DC
  Filled 2020-09-01: qty 95, 48d supply, fill #0

## 2020-09-01 MED ORDER — NORETHINDRONE 0.35 MG PO TABS
1.0000 | ORAL_TABLET | Freq: Every day | ORAL | 4 refills | Status: DC
  Filled 2020-09-01 – 2020-11-03 (×2): qty 84, 84d supply, fill #0
  Filled 2021-01-24: qty 84, 84d supply, fill #1
  Filled 2021-04-17: qty 84, 84d supply, fill #2
  Filled 2021-06-24: qty 84, 84d supply, fill #3

## 2020-09-02 ENCOUNTER — Other Ambulatory Visit (HOSPITAL_COMMUNITY): Payer: Self-pay

## 2020-09-03 LAB — HM PAP SMEAR

## 2020-09-07 ENCOUNTER — Other Ambulatory Visit (HOSPITAL_COMMUNITY): Payer: Self-pay

## 2020-09-07 MED ORDER — CARESTART COVID-19 HOME TEST VI KIT
PACK | 0 refills | Status: DC
  Filled 2020-09-07: qty 4, 4d supply, fill #0

## 2020-09-29 ENCOUNTER — Encounter: Payer: Self-pay | Admitting: Physician Assistant

## 2020-09-29 ENCOUNTER — Other Ambulatory Visit (HOSPITAL_COMMUNITY): Payer: Self-pay

## 2020-09-29 MED FILL — Pantoprazole Sodium EC Tab 20 MG (Base Equiv): ORAL | 90 days supply | Qty: 90 | Fill #1 | Status: AC

## 2020-10-14 DIAGNOSIS — Z1231 Encounter for screening mammogram for malignant neoplasm of breast: Secondary | ICD-10-CM | POA: Diagnosis not present

## 2020-10-14 LAB — HM MAMMOGRAPHY

## 2020-11-02 ENCOUNTER — Other Ambulatory Visit (HOSPITAL_COMMUNITY): Payer: Self-pay

## 2020-11-03 ENCOUNTER — Other Ambulatory Visit (HOSPITAL_COMMUNITY): Payer: Self-pay

## 2020-11-10 ENCOUNTER — Other Ambulatory Visit: Payer: Self-pay | Admitting: Family Medicine

## 2020-11-11 ENCOUNTER — Other Ambulatory Visit (HOSPITAL_COMMUNITY): Payer: Self-pay

## 2020-11-11 MED ORDER — BUSPIRONE HCL 5 MG PO TABS
5.0000 mg | ORAL_TABLET | Freq: Two times a day (BID) | ORAL | 0 refills | Status: DC
  Filled 2020-11-11: qty 60, 30d supply, fill #0

## 2020-12-21 ENCOUNTER — Other Ambulatory Visit: Payer: Self-pay

## 2020-12-21 ENCOUNTER — Other Ambulatory Visit (HOSPITAL_COMMUNITY): Payer: Self-pay

## 2020-12-21 ENCOUNTER — Other Ambulatory Visit: Payer: Self-pay | Admitting: Physician Assistant

## 2020-12-21 MED ORDER — BUSPIRONE HCL 5 MG PO TABS
5.0000 mg | ORAL_TABLET | Freq: Two times a day (BID) | ORAL | 0 refills | Status: DC
  Filled 2020-12-21: qty 60, 30d supply, fill #0

## 2020-12-21 MED ORDER — AMLODIPINE BESYLATE 2.5 MG PO TABS
2.5000 mg | ORAL_TABLET | Freq: Every day | ORAL | 0 refills | Status: DC
  Filled 2020-12-21: qty 30, 30d supply, fill #0

## 2020-12-21 MED ORDER — PANTOPRAZOLE SODIUM 20 MG PO TBEC
20.0000 mg | DELAYED_RELEASE_TABLET | Freq: Every day | ORAL | 4 refills | Status: DC
  Filled 2020-12-21: qty 90, 90d supply, fill #0
  Filled 2021-03-28: qty 90, 90d supply, fill #1
  Filled 2021-06-24: qty 90, 90d supply, fill #2
  Filled 2021-09-27: qty 32, 32d supply, fill #3
  Filled 2021-09-27: qty 28, 28d supply, fill #3
  Filled 2021-09-27: qty 62, 62d supply, fill #3

## 2020-12-22 ENCOUNTER — Other Ambulatory Visit: Payer: Self-pay | Admitting: Physician Assistant

## 2020-12-22 ENCOUNTER — Other Ambulatory Visit (HOSPITAL_COMMUNITY): Payer: Self-pay

## 2020-12-22 ENCOUNTER — Encounter: Payer: Self-pay | Admitting: Physician Assistant

## 2020-12-22 MED ORDER — OSELTAMIVIR PHOSPHATE 75 MG PO CAPS
75.0000 mg | ORAL_CAPSULE | Freq: Two times a day (BID) | ORAL | 0 refills | Status: DC
  Filled 2020-12-22: qty 10, 5d supply, fill #0

## 2021-01-05 ENCOUNTER — Other Ambulatory Visit (HOSPITAL_COMMUNITY): Payer: Self-pay

## 2021-01-05 ENCOUNTER — Encounter: Payer: Self-pay | Admitting: Physician Assistant

## 2021-01-05 ENCOUNTER — Telehealth (INDEPENDENT_AMBULATORY_CARE_PROVIDER_SITE_OTHER): Payer: 59 | Admitting: Family Medicine

## 2021-01-05 ENCOUNTER — Telehealth: Payer: 59 | Admitting: Family Medicine

## 2021-01-05 ENCOUNTER — Other Ambulatory Visit: Payer: Self-pay

## 2021-01-05 DIAGNOSIS — J0111 Acute recurrent frontal sinusitis: Secondary | ICD-10-CM | POA: Diagnosis not present

## 2021-01-05 MED ORDER — DOXYCYCLINE HYCLATE 100 MG PO TABS
100.0000 mg | ORAL_TABLET | Freq: Two times a day (BID) | ORAL | 0 refills | Status: DC
  Filled 2021-01-05: qty 20, 10d supply, fill #0

## 2021-01-05 NOTE — Progress Notes (Signed)
[place] at [facility] [address] [place], [place] [address] [phone]  Date:  01/05/2021   Name:  Andrea Gray   DOB:  1975/11/04   MRN:  [id number]  PCP:  Inda Coke, PA    Chief Complaint: No chief complaint on file.   History of Present Illness:  Andrea Gray is a 45 y.o. very pleasant female patient who presents with the following:  Patient seen today for concern of sinus infection History of hypertension  Patient location is home, my location is home.  Patient identity confirmed with 2 factors, she gives consent for virtual visit today The patient and myself are present on the call today Connected with patient via video monitor.   She is vaccinated against COVID-19  She notes possible sinus infection, teeth are painful  She has noted sx for about 2 months-has been trying to get rid of it on her own but has persisted.  She is taking Sudafed and ibuprofen She is blowing discolored mucus from her nose No fever or cough She does tend towards sinus infection -notes that she tends to get infection about twice a year No diarrhea or vomiting  No chance of current pregnancy   Patient Active Problem List   Diagnosis Date Noted   Anxiety 06/09/2019   Hypertension 11/27/2016    Past Medical History:  Diagnosis Date   Family history of adverse reaction to anesthesia    Father has PONV   Fibroadenoma of right breast    GERD (gastroesophageal reflux disease)    Headache    Kidney stones 2007   PVC's (premature ventricular contractions)    Hx: of   Umbilical hernia     Past Surgical History:  Procedure Laterality Date   BREAST SURGERY  2017   removal of Fibroadenoma   CESAREAN SECTION     CHOLECYSTECTOMY     CYST EXCISION     from lip as a toddler   DILATION AND CURETTAGE OF UTERUS     LASIK     LITHOTRIPSY     MASS EXCISION Right 10/28/2015   Procedure: EXCISION OF RIGHT BREAST FIBROADENOMA;   Surgeon: Jackolyn Confer, MD;  Location: [place];  Service: General;  Laterality: Right;   WISDOM TOOTH EXTRACTION      Social History   Tobacco Use   Smoking status: Never   Smokeless tobacco: Never  Vaping Use   Vaping Use: Never used  Substance Use Topics   Alcohol use: Yes    Comment: daily wine 1-2 glasses   Drug use: No    Family History  Problem Relation Age of Onset   Hypertension Mother    COPD Mother    Depression Mother    Diabetes Mother    Hypertension Father    Alcohol abuse Brother    COPD Brother    Depression Brother    Drug abuse Brother    Hypertension Brother    Learning disabilities Brother    Mental illness Brother    Arthritis Maternal Grandmother    Asthma Maternal Grandmother    Hypertension Maternal Grandmother    Alcohol abuse Maternal Grandfather    Hypertension Maternal Grandfather    Arthritis Paternal Grandmother    Cancer Paternal Grandmother        unknown type   Early death Paternal Grandmother    Hearing loss Paternal Grandfather    Breast cancer Other     Allergies  Allergen Reactions   Penicillins     Has patient had a PCN reaction causing immediate rash, facial/tongue/throat swelling, SOB or lightheadedness with hypotension: Yes Has patient had a PCN reaction causing severe rash involving mucus membranes or skin necrosis: No Has patient had a PCN reaction that required hospitalization Yes Has patient had a PCN reaction occurring within the last 10 years: No If all of the above answers are "NO", then may proceed with Cephalosporin use.      Medication list has been reviewed and updated.  Current Outpatient Medications on File Prior to Visit  Medication Sig Dispense Refill   amLODipine (NORVASC) 2.5 MG tablet Take 1 tablet (2.5 mg total) by mouth daily. 30 tablet 0   busPIRone (BUSPAR) 5 MG tablet Take 1 tablet (5 mg total) by mouth 2 (two) times daily. 95 tablet 4   busPIRone (BUSPAR) 5 MG tablet Take 1 tablet (5 mg  total) by mouth 2 (two) times daily. 60 tablet 0   COVID-19 At Home Antigen Test (CARESTART COVID-19 HOME TEST) KIT Use as directed 4 each 0   doxycycline (VIBRA-TABS) 100 MG tablet TAKE 1 TABLET (100 MG TOTAL) BY MOUTH 2 (TWO) TIMES DAILY. 20 tablet 0   fluticasone (FLONASE) 50 MCG/ACT nasal spray PLACE 2 SPRAYS INTO BOTH NOSTRILS DAILY. 16 g 6   ibuprofen (ADVIL,MOTRIN) 200 MG tablet Take 400 mg by mouth every 6 (six) hours as needed for mild pain.     norethindrone (MICRONOR) 0.35 MG tablet TAKE 1 TABLET BY MOUTH ONCE DAILY 84 tablet 4   norethindrone (MICRONOR) 0.35 MG tablet Take 1 tablet (0.35 mg total) by mouth every evening. 28 tablet 0   norethindrone (MICRONOR) 0.35 MG tablet Take 1 tablet (0.35 mg total) by mouth daily. 140 tablet 0   norethindrone (MICRONOR) 0.35 MG tablet Take 1 tablet (0.35 mg total) by mouth daily. 84 tablet 4   oseltamivir (TAMIFLU) 75 MG capsule Take 1 capsule (75 mg total) by mouth 2 (two) times daily. 10 capsule 0   pantoprazole (PROTONIX) 20 MG tablet Take 1 tablet (20 mg total) by mouth daily. 2 tablet 0   pantoprazole (PROTONIX) 20 MG tablet TAKE 1 TABLET BY MOUTH ONCE DAILY 90 tablet 4   pantoprazole (PROTONIX) 20 MG tablet Take 1 tablet (20 mg total) by mouth daily. 90 tablet 4   No current facility-administered medications on file prior to visit.    Review of Systems:  As per HPI- otherwise negative.   Physical Examination: There were no vitals filed for this visit. There were no vitals filed for this visit. There is no height or weight on file to calculate BMI. Ideal Body Weight:    Patient observed via video monitor.  She looks well, no cough, distress, shortness of breath is noted.  She is not checking vital signs at home  Assessment and Plan: Acute recurrent frontal sinusitis - Plan: doxycycline (VIBRA-TABS) 100 MG tablet  Patient seen today with concern of possible sinus infection.  She tends to get these about twice a year, these are  her typical symptoms.  Called in doxycycline to take twice a day for 10 days.  Advised to take with food and water.  Discussed over-the-counter treatments.  She will let us know if not feeling better in the next few days  Signed Lamar Blinks, MD

## 2021-01-24 ENCOUNTER — Other Ambulatory Visit: Payer: Self-pay | Admitting: Physician Assistant

## 2021-01-25 ENCOUNTER — Other Ambulatory Visit (HOSPITAL_COMMUNITY): Payer: Self-pay

## 2021-01-25 MED ORDER — BUSPIRONE HCL 5 MG PO TABS
5.0000 mg | ORAL_TABLET | Freq: Two times a day (BID) | ORAL | 0 refills | Status: DC
  Filled 2021-01-25: qty 60, 30d supply, fill #0

## 2021-01-25 MED ORDER — AMLODIPINE BESYLATE 2.5 MG PO TABS
2.5000 mg | ORAL_TABLET | Freq: Every day | ORAL | 0 refills | Status: DC
  Filled 2021-01-25: qty 30, 30d supply, fill #0

## 2021-02-28 ENCOUNTER — Other Ambulatory Visit: Payer: Self-pay | Admitting: Physician Assistant

## 2021-02-28 ENCOUNTER — Other Ambulatory Visit (HOSPITAL_COMMUNITY): Payer: Self-pay

## 2021-02-28 MED ORDER — AMLODIPINE BESYLATE 2.5 MG PO TABS
2.5000 mg | ORAL_TABLET | Freq: Every day | ORAL | 0 refills | Status: DC
  Filled 2021-02-28: qty 30, 30d supply, fill #0

## 2021-03-13 ENCOUNTER — Other Ambulatory Visit: Payer: Self-pay | Admitting: Physician Assistant

## 2021-03-14 ENCOUNTER — Other Ambulatory Visit (HOSPITAL_COMMUNITY): Payer: Self-pay

## 2021-03-14 MED ORDER — BUSPIRONE HCL 5 MG PO TABS
5.0000 mg | ORAL_TABLET | Freq: Two times a day (BID) | ORAL | 0 refills | Status: DC
  Filled 2021-03-14: qty 60, 30d supply, fill #0

## 2021-03-29 ENCOUNTER — Other Ambulatory Visit (HOSPITAL_COMMUNITY): Payer: Self-pay

## 2021-04-02 ENCOUNTER — Other Ambulatory Visit: Payer: Self-pay | Admitting: Physician Assistant

## 2021-04-04 ENCOUNTER — Other Ambulatory Visit (HOSPITAL_COMMUNITY): Payer: Self-pay

## 2021-04-04 ENCOUNTER — Other Ambulatory Visit: Payer: Self-pay | Admitting: Physician Assistant

## 2021-04-07 ENCOUNTER — Other Ambulatory Visit: Payer: Self-pay | Admitting: Physician Assistant

## 2021-04-07 ENCOUNTER — Other Ambulatory Visit (HOSPITAL_COMMUNITY): Payer: Self-pay

## 2021-04-12 ENCOUNTER — Other Ambulatory Visit: Payer: Self-pay | Admitting: Physician Assistant

## 2021-04-12 ENCOUNTER — Ambulatory Visit: Payer: 59 | Admitting: Physician Assistant

## 2021-04-12 ENCOUNTER — Other Ambulatory Visit (HOSPITAL_COMMUNITY): Payer: Self-pay

## 2021-04-12 ENCOUNTER — Encounter: Payer: Self-pay | Admitting: Physician Assistant

## 2021-04-12 ENCOUNTER — Other Ambulatory Visit: Payer: Self-pay

## 2021-04-12 VITALS — BP 130/80 | HR 102 | Temp 98.3°F | Ht 65.0 in | Wt 185.2 lb

## 2021-04-12 DIAGNOSIS — R21 Rash and other nonspecific skin eruption: Secondary | ICD-10-CM

## 2021-04-12 DIAGNOSIS — I10 Essential (primary) hypertension: Secondary | ICD-10-CM | POA: Diagnosis not present

## 2021-04-12 MED ORDER — FLUTICASONE PROPIONATE 50 MCG/ACT NA SUSP
2.0000 | Freq: Every day | NASAL | 6 refills | Status: DC
  Filled 2021-04-12: qty 16, 30d supply, fill #0

## 2021-04-12 MED ORDER — AMLODIPINE BESYLATE 2.5 MG PO TABS
2.5000 mg | ORAL_TABLET | Freq: Every day | ORAL | 5 refills | Status: DC
  Filled 2021-04-12: qty 30, 30d supply, fill #0
  Filled 2021-05-11: qty 30, 30d supply, fill #1
  Filled 2021-06-05: qty 30, 30d supply, fill #2
  Filled 2021-06-24: qty 30, 30d supply, fill #3
  Filled 2021-08-02: qty 30, 30d supply, fill #4
  Filled 2021-08-31: qty 30, 30d supply, fill #5

## 2021-04-12 NOTE — Progress Notes (Signed)
Andrea Gray is a 46 y.o. female here for a follow up of HTN.  History of Present Illness:   Chief Complaint  Patient presents with   Hypertension   HTN Currently compliant with taking norvasc 2.5 mg daily with no complications. At home blood pressure readings are: checked regularly and WNL per patient report. Patient denies chest pain, SOB, blurred vision, dizziness, unusual headaches, lower leg swelling. Denies excessive caffeine intake, stimulant usage, excessive alcohol intake, or increase in salt consumption.  BP Readings from Last 3 Encounters:  04/12/21 130/80  01/21/20 128/80  06/09/19 124/80   Andrea Gray expresses she has been experiencing an intermittent Andrea for the past 3 months. Pt describes Andrea as usually raised red bumps that have appeared on her bilateral shin area, groin, scalp, and elbows. She has noticed that not only has she experienced this, but her son with eczema has started experiencing flare-ups which he hasn't had in several years. She has used triamcinolone ointment that she had at home with some improvement of sx.  Denies changes to environment, hygenic products, or recent illness.   Past Medical History:  Diagnosis Date   Family history of adverse reaction to anesthesia    Father has PONV   Fibroadenoma of right breast    GERD (gastroesophageal reflux disease)    Headache    Kidney stones 2007   PVC's (premature ventricular contractions)    Hx: of   Umbilical hernia      Social History   Tobacco Use   Smoking status: Never   Smokeless tobacco: Never  Vaping Use   Vaping Use: Never used  Substance Use Topics   Alcohol use: Yes    Comment: daily wine 1-2 glasses   Drug use: No    Past Surgical History:  Procedure Laterality Date   BREAST SURGERY  2017   removal of Fibroadenoma   CESAREAN SECTION     CHOLECYSTECTOMY     CYST EXCISION     from lip as a toddler   DILATION AND CURETTAGE OF UTERUS     LASIK     LITHOTRIPSY     MASS  EXCISION Right 10/28/2015   Procedure: EXCISION OF RIGHT BREAST FIBROADENOMA;  Surgeon: Jackolyn Confer, MD;  Location: [facility];  Service: General;  Laterality: Right;   WISDOM TOOTH EXTRACTION      Family History  Problem Relation Age of Onset   Hypertension Mother    COPD Mother    Depression Mother    Diabetes Mother    Hypertension Father    Alcohol abuse Brother    COPD Brother    Depression Brother    Drug abuse Brother    Hypertension Brother    Learning disabilities Brother    Mental illness Brother    Arthritis Maternal Grandmother    Asthma Maternal Grandmother    Hypertension Maternal Grandmother    Alcohol abuse Maternal Grandfather    Hypertension Maternal Grandfather    Arthritis Paternal Grandmother    Cancer Paternal Grandmother        unknown type   Early death Paternal Grandmother    Hearing loss Paternal Grandfather    Breast cancer Other     Allergies  Allergen Reactions   Penicillins     Has patient had a PCN reaction causing immediate Andrea, facial/tongue/throat swelling, SOB or lightheadedness with hypotension: Yes Has patient had a PCN reaction causing severe Andrea involving mucus membranes or skin necrosis: No Has patient had a  PCN reaction that required hospitalization Yes Has patient had a PCN reaction occurring within the last 10 years: No If all of the above answers are "NO", then may proceed with Cephalosporin use.      Current Medications:   Current Outpatient Medications:    amLODipine (NORVASC) 2.5 MG tablet, Take 1 tablet (2.5 mg total) by mouth daily., Disp: 30 tablet, Rfl: 0   B Complex Vitamins (B COMPLEX PO), Take 1 tablet by mouth daily in the afternoon., Disp: , Rfl:    busPIRone (BUSPAR) 5 MG tablet, Take 1 tablet (5 mg total) by mouth 2 (two) times daily., Disp: 60 tablet, Rfl: 0   Collagen Hydrolysate POWD, 1 Scoop by Does not apply route daily in the afternoon., Disp: , Rfl:    ibuprofen (ADVIL,MOTRIN) 200 MG tablet, Take 400  mg by mouth every 6 (six) hours as needed for mild pain., Disp: , Rfl:    Multiple Vitamin (MULTIVITAMIN) tablet, Take 1 tablet by mouth daily., Disp: , Rfl:    norethindrone (MICRONOR) 0.35 MG tablet, Take 1 tablet (0.35 mg total) by mouth every evening., Disp: 28 tablet, Rfl: 0   norethindrone (MICRONOR) 0.35 MG tablet, Take 1 tablet (0.35 mg total) by mouth daily., Disp: 84 tablet, Rfl: 4   pantoprazole (PROTONIX) 20 MG tablet, Take 1 tablet (20 mg total) by mouth daily., Disp: 90 tablet, Rfl: 4   fluticasone (FLONASE) 50 MCG/ACT nasal spray, PLACE 2 SPRAYS INTO BOTH NOSTRILS DAILY., Disp: 16 g, Rfl: 6   Review of Systems:   ROS Negative unless otherwise specified per HPI.  Vitals:   Vitals:   04/12/21 0910  BP: 130/80  Pulse: (!) 102  Temp: 98.3 F (36.8 C)  TempSrc: Temporal  SpO2: 97%  Weight: 185 lb 4 oz (84 kg)  Height: 5\' 5"  (1.651 m)     Body mass index is 30.83 kg/m.  Physical Exam:   Physical Exam Vitals and nursing note reviewed.  Constitutional:      General: She is not in acute distress.    Appearance: She is well-developed. She is not ill-appearing or toxic-appearing.  Cardiovascular:     Rate and Rhythm: Normal rate and regular rhythm.     Pulses: Normal pulses.     Heart sounds: Normal heart sounds, S1 normal and S2 normal.  Pulmonary:     Effort: Pulmonary effort is normal.     Breath sounds: Normal breath sounds.  Skin:    General: Skin is warm and dry.  Neurological:     Mental Status: She is alert.     GCS: GCS eye subscore is 4. GCS verbal subscore is 5. GCS motor subscore is 6.  Psychiatric:        Speech: Speech normal.        Behavior: Behavior normal. Behavior is cooperative.    Assessment and Plan:   Essential Hypertension Controlled Continue Amlodipine 2.5 mg daily  Monitor BP regularly 1-2 times a week Encouraged patient to continue participating in healthy eating and daily exercise I advised patient that if BP readings are  consistently >150/90, to reach out to office and medication will be adjusted Follow up in 6 months, sooner if concerns She declined blood work today   Andrea No red flags on exam Consider daily antihistamine of choice Follow up with dermatology   Andrea Gray,acting as a scribe for Inda Coke, PA.,have documented all relevant documentation on the behalf of Inda Coke, PA,as directed by  Inda Coke, PA  while in the presence of Inda Coke, [place].  I, Inda Coke, [place], have reviewed all documentation for this visit. The documentation on 04/12/21 for the exam, diagnosis, procedures, and orders are all accurate and complete.   Inda Coke, PA-C

## 2021-04-13 ENCOUNTER — Encounter: Payer: Self-pay | Admitting: Physician Assistant

## 2021-04-17 ENCOUNTER — Other Ambulatory Visit: Payer: Self-pay | Admitting: Physician Assistant

## 2021-04-18 ENCOUNTER — Other Ambulatory Visit (HOSPITAL_COMMUNITY): Payer: Self-pay

## 2021-04-18 MED ORDER — BUSPIRONE HCL 5 MG PO TABS
5.0000 mg | ORAL_TABLET | Freq: Two times a day (BID) | ORAL | 2 refills | Status: DC
Start: 2021-04-18 — End: 2021-07-19
  Filled 2021-04-18: qty 60, 30d supply, fill #0
  Filled 2021-05-18: qty 60, 30d supply, fill #1
  Filled 2021-06-24: qty 60, 30d supply, fill #2

## 2021-05-04 ENCOUNTER — Other Ambulatory Visit (HOSPITAL_COMMUNITY): Payer: Self-pay

## 2021-05-04 MED ORDER — FLUOCINONIDE 0.05 % EX SOLN
Freq: Four times a day (QID) | CUTANEOUS | 5 refills | Status: DC
Start: 2021-05-03 — End: 2023-04-04
  Filled 2021-05-04 – 2021-06-26 (×2): qty 60, 30d supply, fill #0

## 2021-05-04 MED ORDER — CLOBETASOL PROPIONATE 0.05 % EX CREA
TOPICAL_CREAM | Freq: Two times a day (BID) | CUTANEOUS | 1 refills | Status: AC
  Filled 2021-05-04 – 2021-10-26 (×2): qty 60, 14d supply, fill #0

## 2021-05-04 MED ORDER — KETOCONAZOLE 2 % EX SHAM
MEDICATED_SHAMPOO | CUTANEOUS | 12 refills | Status: DC
Start: 2021-05-05 — End: 2023-04-04
  Filled 2021-05-04: qty 360, 84d supply, fill #0
  Filled 2021-12-24: qty 360, 84d supply, fill #1

## 2021-05-04 MED ORDER — TRIAMCINOLONE ACETONIDE 0.1 % EX CREA
TOPICAL_CREAM | Freq: Two times a day (BID) | CUTANEOUS | 1 refills | Status: AC
Start: 2021-05-03 — End: ?
  Filled 2021-05-04: qty 454, 30d supply, fill #0

## 2021-05-11 ENCOUNTER — Other Ambulatory Visit (HOSPITAL_COMMUNITY): Payer: Self-pay

## 2021-05-18 ENCOUNTER — Other Ambulatory Visit (HOSPITAL_COMMUNITY): Payer: Self-pay

## 2021-06-06 ENCOUNTER — Other Ambulatory Visit (HOSPITAL_COMMUNITY): Payer: Self-pay

## 2021-06-21 DIAGNOSIS — H524 Presbyopia: Secondary | ICD-10-CM | POA: Diagnosis not present

## 2021-06-21 DIAGNOSIS — H52223 Regular astigmatism, bilateral: Secondary | ICD-10-CM | POA: Diagnosis not present

## 2021-06-24 ENCOUNTER — Other Ambulatory Visit (HOSPITAL_COMMUNITY): Payer: Self-pay

## 2021-06-27 ENCOUNTER — Other Ambulatory Visit (HOSPITAL_COMMUNITY): Payer: Self-pay

## 2021-06-28 ENCOUNTER — Other Ambulatory Visit (HOSPITAL_COMMUNITY): Payer: Self-pay

## 2021-06-29 ENCOUNTER — Other Ambulatory Visit (HOSPITAL_COMMUNITY): Payer: Self-pay

## 2021-07-19 ENCOUNTER — Other Ambulatory Visit (HOSPITAL_COMMUNITY): Payer: Self-pay

## 2021-07-19 ENCOUNTER — Other Ambulatory Visit: Payer: Self-pay | Admitting: Physician Assistant

## 2021-07-19 MED ORDER — BUSPIRONE HCL 5 MG PO TABS
5.0000 mg | ORAL_TABLET | Freq: Two times a day (BID) | ORAL | 2 refills | Status: DC
  Filled 2021-07-19: qty 60, 30d supply, fill #0
  Filled 2021-08-23: qty 60, 30d supply, fill #1
  Filled 2021-09-19: qty 60, 30d supply, fill #2

## 2021-08-02 ENCOUNTER — Other Ambulatory Visit (HOSPITAL_COMMUNITY): Payer: Self-pay

## 2021-08-23 ENCOUNTER — Other Ambulatory Visit (HOSPITAL_COMMUNITY): Payer: Self-pay

## 2021-08-31 ENCOUNTER — Other Ambulatory Visit (HOSPITAL_COMMUNITY): Payer: Self-pay

## 2021-09-19 ENCOUNTER — Other Ambulatory Visit (HOSPITAL_COMMUNITY): Payer: Self-pay

## 2021-09-26 ENCOUNTER — Ambulatory Visit (INDEPENDENT_AMBULATORY_CARE_PROVIDER_SITE_OTHER): Payer: 59 | Admitting: Podiatry

## 2021-09-26 ENCOUNTER — Ambulatory Visit (INDEPENDENT_AMBULATORY_CARE_PROVIDER_SITE_OTHER): Payer: 59

## 2021-09-26 DIAGNOSIS — M778 Other enthesopathies, not elsewhere classified: Secondary | ICD-10-CM | POA: Diagnosis not present

## 2021-09-26 NOTE — Progress Notes (Signed)
Chief Complaint  Patient presents with   Foot Pain    right foot pain on top of foot not gotten better since visit in 2021    HPI: 46 y.o. female presenting today for continued pain and tenderness to the right foot.  Patient was last seen in the office 01/28/2020 for ganglion cyst of the right foot.  Drainage of the ganglion cyst was performed.  Patient states that she continues to have pain and tenderness on a daily basis.  She presents for further treatment and evaluation  Past Medical History:  Diagnosis Date   Family history of adverse reaction to anesthesia    Father has PONV   Fibroadenoma of right breast    GERD (gastroesophageal reflux disease)    Headache    Kidney stones 2007   PVC's (premature ventricular contractions)    Hx: of   Umbilical hernia     Past Surgical History:  Procedure Laterality Date   BREAST SURGERY  2017   removal of Fibroadenoma   CESAREAN SECTION     CHOLECYSTECTOMY     CYST EXCISION     from lip as a toddler   DILATION AND CURETTAGE OF UTERUS     LASIK     LITHOTRIPSY     MASS EXCISION Right 10/28/2015   Procedure: EXCISION OF RIGHT BREAST FIBROADENOMA;  Surgeon: Jackolyn Confer, MD;  Location: [facility];  Service: General;  Laterality: Right;   WISDOM TOOTH EXTRACTION      Allergies  Allergen Reactions   Penicillins     Has patient had a PCN reaction causing immediate rash, facial/tongue/throat swelling, SOB or lightheadedness with hypotension: Yes Has patient had a PCN reaction causing severe rash involving mucus membranes or skin necrosis: No Has patient had a PCN reaction that required hospitalization Yes Has patient had a PCN reaction occurring within the last 10 years: No If all of the above answers are "NO", then may proceed with Cephalosporin use.       Physical Exam: General: The patient is alert and oriented x3 in no acute distress.  Dermatology: Skin is warm, dry and supple bilateral lower extremities. Negative for open  lesions or macerations.  Vascular: Palpable pedal pulses bilaterally. Capillary refill within normal limits.  Negative for any significant edema or erythema  Neurological: Light touch and protective threshold grossly intact  Musculoskeletal Exam: Soft tissue enlargement tenderness along palpation to the dorsal aspect of the right midtarsal joint.  There are some very mild/slight fluctuance but no apparent isolated ganglion cyst.  Radiographic Exam:  Normal osseous mineralization. Joint spaces preserved. No fracture/dislocation/boney destruction.  Lateral view does demonstrate some possible soft tissue enlargement along the dorsum of the foot  Assessment: 1.  Soft tissue mass dorsum of the right foot   Plan of Care:  1. Patient evaluated. X-Rays reviewed.  2.  The patient has not had any improvement since 01/28/2020 despite changing shoes and multiple office visits.  Visit of 01/20/2019 MRI was actually ordered but not completed.  Today we are going to order MRI RT foot without contrast 3.  Return to clinic as needed.  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      Edrick Kins, DPM [facility]  Dr. Edrick Kins, DPM    [address]  [place], [place] [address]                Office [phone]  Fax [phone]

## 2021-09-27 ENCOUNTER — Other Ambulatory Visit: Payer: Self-pay | Admitting: Physician Assistant

## 2021-09-27 ENCOUNTER — Other Ambulatory Visit (HOSPITAL_COMMUNITY): Payer: Self-pay

## 2021-09-27 MED ORDER — NORETHINDRONE 0.35 MG PO TABS
1.0000 | ORAL_TABLET | Freq: Every day | ORAL | 1 refills | Status: DC
  Filled 2021-09-27: qty 56, 56d supply, fill #0

## 2021-09-28 ENCOUNTER — Other Ambulatory Visit (HOSPITAL_COMMUNITY): Payer: Self-pay

## 2021-09-29 ENCOUNTER — Other Ambulatory Visit (HOSPITAL_COMMUNITY): Payer: Self-pay

## 2021-09-29 MED ORDER — BUSPIRONE HCL 5 MG PO TABS
5.0000 mg | ORAL_TABLET | Freq: Two times a day (BID) | ORAL | 2 refills | Status: DC
  Filled 2021-09-29 – 2021-10-20 (×2): qty 60, 30d supply, fill #0
  Filled 2021-11-22: qty 60, 30d supply, fill #1
  Filled 2021-12-20: qty 60, 30d supply, fill #2

## 2021-09-29 MED ORDER — AMLODIPINE BESYLATE 2.5 MG PO TABS
2.5000 mg | ORAL_TABLET | Freq: Every day | ORAL | 5 refills | Status: DC
  Filled 2021-09-29: qty 30, 30d supply, fill #0
  Filled 2021-10-20 – 2021-11-02 (×2): qty 30, 30d supply, fill #1
  Filled 2021-11-22: qty 30, 30d supply, fill #2
  Filled 2021-12-24: qty 30, 30d supply, fill #3
  Filled 2022-01-18 (×3): qty 30, 30d supply, fill #4
  Filled 2022-02-13: qty 30, 30d supply, fill #5
  Filled 2022-02-17: qty 30, 30d supply, fill #0
  Filled 2022-02-17: qty 30, 30d supply, fill #5

## 2021-10-07 ENCOUNTER — Other Ambulatory Visit: Payer: 59

## 2021-10-10 ENCOUNTER — Ambulatory Visit
Admission: RE | Admit: 2021-10-10 | Discharge: 2021-10-10 | Disposition: A | Discharge status: Home / Self Care | Payer: 59 | Source: Ambulatory Visit | Attending: Podiatry | Admitting: Podiatry

## 2021-10-10 DIAGNOSIS — R600 Localized edema: Secondary | ICD-10-CM | POA: Diagnosis not present

## 2021-10-10 DIAGNOSIS — M778 Other enthesopathies, not elsewhere classified: Secondary | ICD-10-CM

## 2021-10-13 ENCOUNTER — Encounter: Payer: Self-pay | Admitting: Podiatry

## 2021-10-19 ENCOUNTER — Telehealth: Payer: Self-pay | Admitting: *Deleted

## 2021-10-19 NOTE — Telephone Encounter (Signed)
Patient is calling to request MRI results from 10/11/21, please advise.

## 2021-10-20 ENCOUNTER — Other Ambulatory Visit (HOSPITAL_COMMUNITY): Payer: Self-pay

## 2021-10-21 ENCOUNTER — Other Ambulatory Visit (HOSPITAL_COMMUNITY): Payer: Self-pay

## 2021-10-21 ENCOUNTER — Other Ambulatory Visit: Payer: Self-pay | Admitting: Podiatry

## 2021-10-21 DIAGNOSIS — Z1231 Encounter for screening mammogram for malignant neoplasm of breast: Secondary | ICD-10-CM | POA: Diagnosis not present

## 2021-10-21 DIAGNOSIS — Z01419 Encounter for gynecological examination (general) (routine) without abnormal findings: Secondary | ICD-10-CM | POA: Diagnosis not present

## 2021-10-21 MED ORDER — CELECOXIB 100 MG PO CAPS
100.0000 mg | ORAL_CAPSULE | Freq: Two times a day (BID) | ORAL | 3 refills | Status: DC
  Filled 2021-10-21: qty 60, 30d supply, fill #0
  Filled 2021-11-22: qty 60, 30d supply, fill #1
  Filled 2021-12-20: qty 60, 30d supply, fill #2
  Filled 2022-01-18: qty 60, 30d supply, fill #3

## 2021-10-21 MED ORDER — NORETHINDRONE 0.35 MG PO TABS
1.0000 | ORAL_TABLET | Freq: Every day | ORAL | 4 refills | Status: DC
  Filled 2021-10-21 – 2021-11-22 (×4): qty 84, 84d supply, fill #0
  Filled 2022-01-18 – 2022-02-14 (×3): qty 84, 84d supply, fill #1
  Filled 2022-04-20 – 2022-04-24 (×3): qty 84, 84d supply, fill #2
  Filled 2022-08-01: qty 84, 84d supply, fill #3

## 2021-10-21 MED ORDER — PANTOPRAZOLE SODIUM 40 MG PO TBEC
40.0000 mg | DELAYED_RELEASE_TABLET | Freq: Every day | ORAL | 4 refills | Status: DC
  Filled 2021-10-21: qty 90, 90d supply, fill #0
  Filled 2022-01-18: qty 90, 90d supply, fill #1
  Filled 2022-04-20: qty 90, 90d supply, fill #2
  Filled 2022-06-15 – 2022-07-24 (×2): qty 90, 90d supply, fill #3
  Filled 2022-08-22: qty 90, 90d supply, fill #4

## 2021-10-21 NOTE — Telephone Encounter (Signed)
Spoke with patient.  MRI was reviewed over the phone.  Prescription for Celebrex 100 mg twice daily sent to the pharmacy.  She is also going to come into the office next week for follow-up appointment.  Message sent to scheduling.  Thanks, Dr. Amalia Hailey

## 2021-10-21 NOTE — Telephone Encounter (Signed)
Patient is calling again for her MRI results. Please advise.

## 2021-10-21 NOTE — Progress Notes (Signed)
As needed severe arthritis RT foot

## 2021-10-24 NOTE — Telephone Encounter (Signed)
Called pt and lvm for pt to callback and schedule follow up appt this week with Dr. Amalia Hailey.

## 2021-10-26 ENCOUNTER — Ambulatory Visit (INDEPENDENT_AMBULATORY_CARE_PROVIDER_SITE_OTHER): Payer: 59 | Admitting: Podiatry

## 2021-10-26 ENCOUNTER — Ambulatory Visit: Payer: 59

## 2021-10-26 DIAGNOSIS — M778 Other enthesopathies, not elsewhere classified: Secondary | ICD-10-CM

## 2021-10-26 MED ORDER — BETAMETHASONE SOD PHOS & ACET 6 (3-3) MG/ML IJ SUSP
3.0000 mg | Freq: Once | INTRAMUSCULAR | Status: AC
  Administered 2021-10-26: 3 mg via INTRA_ARTICULAR

## 2021-10-26 NOTE — Progress Notes (Signed)
Chief Complaint  Patient presents with   Foot Pain    Patient is here for right foot pain on the top of the foot, she states that she is hee for injection and to be fitted for a boot.    HPI: 46 y.o. female presenting today to discuss the MRI results and for further treatment and evaluation of right foot pain.  MRI results were discussed over the phone and she presents today to possibly have an injection into the right midfoot and wear a cam booT as discussed over the phone when MRI results were reviewed  Past Medical History:  Diagnosis Date   Family history of adverse reaction to anesthesia    Father has PONV   Fibroadenoma of right breast    GERD (gastroesophageal reflux disease)    Headache    Kidney stones 2007   PVC's (premature ventricular contractions)    Hx: of   Umbilical hernia     Past Surgical History:  Procedure Laterality Date   BREAST SURGERY  2017   removal of Fibroadenoma   CESAREAN SECTION     CHOLECYSTECTOMY     CYST EXCISION     from lip as a toddler   DILATION AND CURETTAGE OF UTERUS     LASIK     LITHOTRIPSY     MASS EXCISION Right 10/28/2015   Procedure: EXCISION OF RIGHT BREAST FIBROADENOMA;  Surgeon: Jackolyn Confer, MD;  Location: [facility];  Service: General;  Laterality: Right;   WISDOM TOOTH EXTRACTION      Allergies  Allergen Reactions   Penicillins     Has patient had a PCN reaction causing immediate rash, facial/tongue/throat swelling, SOB or lightheadedness with hypotension: Yes Has patient had a PCN reaction causing severe rash involving mucus membranes or skin necrosis: No Has patient had a PCN reaction that required hospitalization Yes Has patient had a PCN reaction occurring within the last 10 years: No If all of the above answers are "NO", then may proceed with Cephalosporin use.       Physical Exam: General: The patient is alert and oriented x3 in no acute distress.  Dermatology: Skin is warm, dry and supple bilateral lower  extremities. Negative for open lesions or macerations.  Vascular: Palpable pedal pulses bilaterally. Capillary refill within normal limits.  Negative for any significant edema or erythema  Neurological: Light touch and protective threshold grossly intact  Musculoskeletal Exam: Soft tissue enlargement tenderness along palpation to the dorsal aspect of the right midtarsal joint.  There are some very mild/slight fluctuance but no apparent isolated ganglion cyst.  MRI RT FOOT WO CONTRAST 10/10/2021:  IMPRESSION: 1. Severe osteoarthritic changes of the second tarsometatarsal joint with prominent subchondral bone marrow edema along both sides of the joint. Soft tissue edema is present within the dorsal soft tissues overlying the second TMT joint level, likely reactive. Correlate clinically to exclude cellulitis at this location. 2. Mild bone marrow edema within the third metatarsal shaft without a well defined fracture line. Findings are favored to represent stress changes. 3. Appearance of the second metatarsal shaft favors a bone infarction. 4. Small first, second, and third intermetatarsal space fluid collections which may represent mild bursitis. 5. Mild hallux valgus alignment with mild first MTP joint osteoarthritis.  Assessment: 1.  Severe DJD RT TMT 2.  Bone marrow edema third metatarsal RT   Plan of Care:  1. Patient evaluated.  MRI reviewed again today.  2.  Injection of 0.5 cc Celestone Soluspan injected  around the second TMT of the right foot 3.  Continue Celebrex 100 mg 2 times daily as prescribed 4.  Recommend OTC arch supports daily and advised against going barefoot 5.  Due to the bone marrow edema, I did dispense a cam boot.  Wear daily x4 weeks 6.  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      Edrick Kins, DPM [facility]  Dr. Edrick Kins, DPM    [address], [place] [address]                Office [phone]  Fax [phone]

## 2021-10-27 ENCOUNTER — Other Ambulatory Visit (HOSPITAL_COMMUNITY): Payer: Self-pay

## 2021-10-27 DIAGNOSIS — Z1211 Encounter for screening for malignant neoplasm of colon: Secondary | ICD-10-CM | POA: Diagnosis not present

## 2021-10-28 ENCOUNTER — Other Ambulatory Visit (HOSPITAL_COMMUNITY): Payer: Self-pay

## 2021-11-02 ENCOUNTER — Other Ambulatory Visit (HOSPITAL_COMMUNITY): Payer: Self-pay

## 2021-11-02 LAB — COLOGUARD: COLOGUARD: NEGATIVE

## 2021-11-07 ENCOUNTER — Encounter: Payer: Self-pay | Admitting: *Deleted

## 2021-11-22 ENCOUNTER — Other Ambulatory Visit (HOSPITAL_COMMUNITY): Payer: Self-pay

## 2021-11-24 ENCOUNTER — Other Ambulatory Visit (HOSPITAL_COMMUNITY): Payer: Self-pay

## 2021-11-28 ENCOUNTER — Other Ambulatory Visit (HOSPITAL_COMMUNITY): Payer: Self-pay

## 2021-12-05 ENCOUNTER — Other Ambulatory Visit (HOSPITAL_BASED_OUTPATIENT_CLINIC_OR_DEPARTMENT_OTHER): Payer: Self-pay

## 2021-12-05 MED ORDER — INFLUENZA VAC SPLIT QUAD 0.5 ML IM SUSY
PREFILLED_SYRINGE | INTRAMUSCULAR | 0 refills | Status: DC
  Filled 2021-12-05: qty 0.5, 1d supply, fill #0

## 2021-12-20 ENCOUNTER — Other Ambulatory Visit: Payer: Self-pay | Admitting: Physician Assistant

## 2021-12-21 ENCOUNTER — Other Ambulatory Visit (HOSPITAL_COMMUNITY): Payer: Self-pay

## 2021-12-21 MED ORDER — ONE-DAILY MULTI VITAMINS PO TABS
1.0000 | ORAL_TABLET | Freq: Every day | ORAL | 3 refills | Status: AC
  Filled 2021-12-21: qty 90, 90d supply, fill #0

## 2021-12-26 ENCOUNTER — Other Ambulatory Visit (HOSPITAL_COMMUNITY): Payer: Self-pay

## 2022-01-18 ENCOUNTER — Other Ambulatory Visit (HOSPITAL_COMMUNITY): Payer: Self-pay

## 2022-01-18 ENCOUNTER — Other Ambulatory Visit: Payer: Self-pay

## 2022-01-18 ENCOUNTER — Other Ambulatory Visit: Payer: Self-pay | Admitting: Physician Assistant

## 2022-01-18 MED ORDER — BUSPIRONE HCL 5 MG PO TABS
5.0000 mg | ORAL_TABLET | Freq: Two times a day (BID) | ORAL | 0 refills | Status: DC
  Filled 2022-01-18: qty 60, 30d supply, fill #0

## 2022-01-26 ENCOUNTER — Encounter: Payer: Self-pay | Admitting: *Deleted

## 2022-02-13 ENCOUNTER — Other Ambulatory Visit: Payer: Self-pay | Admitting: Podiatry

## 2022-02-13 ENCOUNTER — Other Ambulatory Visit: Payer: Self-pay | Admitting: Physician Assistant

## 2022-02-13 ENCOUNTER — Other Ambulatory Visit (HOSPITAL_BASED_OUTPATIENT_CLINIC_OR_DEPARTMENT_OTHER): Payer: Self-pay

## 2022-02-14 ENCOUNTER — Other Ambulatory Visit: Payer: Self-pay

## 2022-02-14 ENCOUNTER — Other Ambulatory Visit (HOSPITAL_BASED_OUTPATIENT_CLINIC_OR_DEPARTMENT_OTHER): Payer: Self-pay

## 2022-02-14 MED ORDER — BUSPIRONE HCL 5 MG PO TABS
5.0000 mg | ORAL_TABLET | Freq: Two times a day (BID) | ORAL | 0 refills | Status: DC
  Filled 2022-02-14 – 2022-02-16 (×2): qty 60, 30d supply, fill #0

## 2022-02-15 ENCOUNTER — Other Ambulatory Visit (HOSPITAL_BASED_OUTPATIENT_CLINIC_OR_DEPARTMENT_OTHER): Payer: Self-pay

## 2022-02-15 MED ORDER — CELECOXIB 100 MG PO CAPS
100.0000 mg | ORAL_CAPSULE | Freq: Two times a day (BID) | ORAL | 3 refills | Status: DC
  Filled 2022-02-15 – 2022-02-16 (×2): qty 60, 30d supply, fill #0

## 2022-02-16 ENCOUNTER — Other Ambulatory Visit: Payer: Self-pay

## 2022-02-16 ENCOUNTER — Other Ambulatory Visit (HOSPITAL_BASED_OUTPATIENT_CLINIC_OR_DEPARTMENT_OTHER): Payer: Self-pay

## 2022-02-17 ENCOUNTER — Other Ambulatory Visit: Payer: Self-pay

## 2022-02-17 ENCOUNTER — Other Ambulatory Visit (HOSPITAL_BASED_OUTPATIENT_CLINIC_OR_DEPARTMENT_OTHER): Payer: Self-pay

## 2022-03-10 ENCOUNTER — Encounter: Payer: Self-pay | Admitting: Physician Assistant

## 2022-03-16 ENCOUNTER — Other Ambulatory Visit: Payer: Self-pay | Admitting: Physician Assistant

## 2022-03-16 ENCOUNTER — Other Ambulatory Visit (HOSPITAL_BASED_OUTPATIENT_CLINIC_OR_DEPARTMENT_OTHER): Payer: Self-pay

## 2022-03-16 MED ORDER — AMLODIPINE BESYLATE 2.5 MG PO TABS
2.5000 mg | ORAL_TABLET | Freq: Every day | ORAL | 0 refills | Status: DC
  Filled 2022-03-16: qty 30, 30d supply, fill #0

## 2022-03-16 MED ORDER — BUSPIRONE HCL 5 MG PO TABS
5.0000 mg | ORAL_TABLET | Freq: Two times a day (BID) | ORAL | 0 refills | Status: DC
  Filled 2022-03-16: qty 60, 30d supply, fill #0

## 2022-04-20 ENCOUNTER — Other Ambulatory Visit: Payer: Self-pay | Admitting: Physician Assistant

## 2022-04-20 ENCOUNTER — Other Ambulatory Visit (HOSPITAL_BASED_OUTPATIENT_CLINIC_OR_DEPARTMENT_OTHER): Payer: Self-pay

## 2022-04-20 MED ORDER — AMLODIPINE BESYLATE 2.5 MG PO TABS
2.5000 mg | ORAL_TABLET | Freq: Every day | ORAL | 0 refills | Status: DC
  Filled 2022-04-20: qty 30, 30d supply, fill #0

## 2022-04-20 MED ORDER — FLUTICASONE PROPIONATE 50 MCG/ACT NA SUSP
2.0000 | Freq: Every day | NASAL | 6 refills | Status: DC
  Filled 2022-04-20: qty 16, 30d supply, fill #0
  Filled 2022-06-15: qty 16, 30d supply, fill #1
  Filled 2022-10-28: qty 16, 30d supply, fill #2
  Filled 2023-01-18: qty 16, 30d supply, fill #3
  Filled 2023-03-21: qty 16, 30d supply, fill #4

## 2022-04-20 MED ORDER — BUSPIRONE HCL 5 MG PO TABS
5.0000 mg | ORAL_TABLET | Freq: Two times a day (BID) | ORAL | 0 refills | Status: DC
  Filled 2022-04-20: qty 60, 30d supply, fill #0

## 2022-04-21 ENCOUNTER — Other Ambulatory Visit (HOSPITAL_BASED_OUTPATIENT_CLINIC_OR_DEPARTMENT_OTHER): Payer: Self-pay

## 2022-04-21 ENCOUNTER — Other Ambulatory Visit: Payer: Self-pay

## 2022-05-17 ENCOUNTER — Encounter (HOSPITAL_BASED_OUTPATIENT_CLINIC_OR_DEPARTMENT_OTHER): Payer: Self-pay | Admitting: Pharmacist

## 2022-05-17 ENCOUNTER — Other Ambulatory Visit (HOSPITAL_BASED_OUTPATIENT_CLINIC_OR_DEPARTMENT_OTHER): Payer: Self-pay

## 2022-05-17 ENCOUNTER — Other Ambulatory Visit: Payer: Self-pay | Admitting: Physician Assistant

## 2022-05-21 ENCOUNTER — Other Ambulatory Visit (HOSPITAL_BASED_OUTPATIENT_CLINIC_OR_DEPARTMENT_OTHER): Payer: Self-pay

## 2022-05-21 ENCOUNTER — Other Ambulatory Visit: Payer: Self-pay | Admitting: Physician Assistant

## 2022-05-22 ENCOUNTER — Other Ambulatory Visit (HOSPITAL_BASED_OUTPATIENT_CLINIC_OR_DEPARTMENT_OTHER): Payer: Self-pay

## 2022-05-22 ENCOUNTER — Encounter (HOSPITAL_BASED_OUTPATIENT_CLINIC_OR_DEPARTMENT_OTHER): Payer: Self-pay | Admitting: Pharmacist

## 2022-05-23 ENCOUNTER — Encounter: Payer: Self-pay | Admitting: Physician Assistant

## 2022-05-23 ENCOUNTER — Other Ambulatory Visit (HOSPITAL_BASED_OUTPATIENT_CLINIC_OR_DEPARTMENT_OTHER): Payer: Self-pay

## 2022-05-23 MED ORDER — AMLODIPINE BESYLATE 2.5 MG PO TABS
2.5000 mg | ORAL_TABLET | Freq: Every day | ORAL | 0 refills | Status: DC
  Filled 2022-05-23: qty 30, 30d supply, fill #0

## 2022-05-23 MED ORDER — BUSPIRONE HCL 5 MG PO TABS
5.0000 mg | ORAL_TABLET | Freq: Two times a day (BID) | ORAL | 0 refills | Status: DC
  Filled 2022-05-23: qty 60, 30d supply, fill #0

## 2022-05-31 DIAGNOSIS — N760 Acute vaginitis: Secondary | ICD-10-CM | POA: Diagnosis not present

## 2022-05-31 DIAGNOSIS — N898 Other specified noninflammatory disorders of vagina: Secondary | ICD-10-CM | POA: Diagnosis not present

## 2022-05-31 DIAGNOSIS — R300 Dysuria: Secondary | ICD-10-CM | POA: Diagnosis not present

## 2022-06-05 ENCOUNTER — Ambulatory Visit (INDEPENDENT_AMBULATORY_CARE_PROVIDER_SITE_OTHER): Payer: 59 | Admitting: Podiatry

## 2022-06-05 DIAGNOSIS — M778 Other enthesopathies, not elsewhere classified: Secondary | ICD-10-CM | POA: Diagnosis not present

## 2022-06-05 DIAGNOSIS — M19071 Primary osteoarthritis, right ankle and foot: Secondary | ICD-10-CM | POA: Diagnosis not present

## 2022-06-05 MED ORDER — METHYLPREDNISOLONE 4 MG PO TBPK: ORAL_TABLET | ORAL | 0 refills | Status: DC

## 2022-06-05 NOTE — Progress Notes (Unsigned)
Chief Complaint  Patient presents with   Foot Pain    Patient is here for right foot pain on the top of the foot, and left heel ,she states that she is here for injections today, rate of pain 8 out of 10,     HPI: 47 y.o. female presenting today to discuss the MRI results and for further treatment and evaluation of right foot pain.  MRI results were discussed over the phone and she presents today to possibly have an injection into the right midfoot and wear a cam booT as discussed over the phone when MRI results were reviewed  Past Medical History:  Diagnosis Date   Family history of adverse reaction to anesthesia    Father has PONV   Fibroadenoma of right breast    GERD (gastroesophageal reflux disease)    Headache    Kidney stones 2007   PVC's (premature ventricular contractions)    Hx: of   Umbilical hernia     Past Surgical History:  Procedure Laterality Date   BREAST SURGERY  2017   removal of Fibroadenoma   CESAREAN SECTION     CHOLECYSTECTOMY     CYST EXCISION     from lip as a toddler   DILATION AND CURETTAGE OF UTERUS     LASIK     LITHOTRIPSY     MASS EXCISION Right 10/28/2015   Procedure: EXCISION OF RIGHT BREAST FIBROADENOMA;  Surgeon: Avel Peace, MD;  Location: [facility];  Service: General;  Laterality: Right;   WISDOM TOOTH EXTRACTION      Allergies  Allergen Reactions   Penicillins     Has patient had a PCN reaction causing immediate rash, facial/tongue/throat swelling, SOB or lightheadedness with hypotension: Yes Has patient had a PCN reaction causing severe rash involving mucus membranes or skin necrosis: No Has patient had a PCN reaction that required hospitalization Yes Has patient had a PCN reaction occurring within the last 10 years: No If all of the above answers are "NO", then may proceed with Cephalosporin use.       Physical Exam: General: The patient is alert and oriented x3 in no acute distress.  Dermatology: Skin is warm, dry and  supple bilateral lower extremities. Negative for open lesions or macerations.  Vascular: Palpable pedal pulses bilaterally. Capillary refill within normal limits.  Negative for any significant edema or erythema  Neurological: Light touch and protective threshold grossly intact  Musculoskeletal Exam: Soft tissue enlargement tenderness along palpation to the dorsal aspect of the right midtarsal joint.  There are some very mild/slight fluctuance but no apparent isolated ganglion cyst.  MRI RT FOOT WO CONTRAST 10/10/2021:  IMPRESSION: 1. Severe osteoarthritic changes of the second tarsometatarsal joint with prominent subchondral bone marrow edema along both sides of the joint. Soft tissue edema is present within the dorsal soft tissues overlying the second TMT joint level, likely reactive. Correlate clinically to exclude cellulitis at this location. 2. Mild bone marrow edema within the third metatarsal shaft without a well defined fracture line. Findings are favored to represent stress changes. 3. Appearance of the second metatarsal shaft favors a bone infarction. 4. Small first, second, and third intermetatarsal space fluid collections which may represent mild bursitis. 5. Mild hallux valgus alignment with mild first MTP joint osteoarthritis.  Assessment: 1.  Severe DJD WITH CAPSULITIS RT TMT    Plan of Care:  1. Patient evaluated.  Prescription for Medrol Dosepak 2.  Injection of 0.5 cc Celestone Soluspan injected around  the second TMT of the right foot 3.  Continue Celebrex 100 mg 2 times daily as prescribed 4.  Appointment with orthotics department for custom molded orthotics.  I do believe this will help alleviate the pain and tenderness throughout the midfoot and support the DJD along the medial longitudinal arch 5.  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      Felecia Shelling, DPM [facility]  Dr.  Felecia Shelling, DPM    [address] [place], [place] [address]                Office [phone]  Fax [phone]

## 2022-06-06 ENCOUNTER — Encounter: Payer: Self-pay | Admitting: Podiatry

## 2022-06-06 ENCOUNTER — Telehealth: Payer: Self-pay | Admitting: Podiatry

## 2022-06-06 DIAGNOSIS — M19071 Primary osteoarthritis, right ankle and foot: Secondary | ICD-10-CM | POA: Diagnosis not present

## 2022-06-06 DIAGNOSIS — M778 Other enthesopathies, not elsewhere classified: Secondary | ICD-10-CM | POA: Diagnosis not present

## 2022-06-06 MED ORDER — METHYLPREDNISOLONE 4 MG PO TBPK: ORAL_TABLET | ORAL | 0 refills | Status: DC

## 2022-06-06 MED ORDER — BETAMETHASONE SOD PHOS & ACET 6 (3-3) MG/ML IJ SUSP
3.0000 mg | Freq: Once | INTRAMUSCULAR | Status: AC
Start: 2022-06-06 — End: 2022-06-06
  Administered 2022-06-06: 3 mg via INTRA_ARTICULAR

## 2022-06-06 NOTE — Telephone Encounter (Signed)
Pt called and her medication that was sent in yesterday was sent to the wrong pharmacy. [facility] and pt would like it sent to [facility] on [address]> I did change the pharmacy in her chart to the correct one.

## 2022-06-07 NOTE — Progress Notes (Signed)
Subjective:    Andrea Gray is a 47 y.o. female and is here for a comprehensive physical exam.  HPI  Health Maintenance Due  Topic Date Due   DTaP/Tdap/Td (1 - Tdap) Never done   Fecal DNA (Cologuard)  Never done   MAMMOGRAM  10/14/2021    Acute Concerns: None  Chronic Issues: Anxiety Treated with buspirone 5 mg twice daily. This is helping her symptoms without negative side effects. Denies SI/HI  Hypertension  Treated with amlodipine 2.5 mg daily. Blood pressure normal today at 120/80. Denies chest pain, shortness of breath   Plantar Fascitis  She is getting fitted for orthotics soon. Followed by Dr. Logan Bores, podiatrist.  Taking norethindrone 0.35 mg daily for birth control. Seen by dermatologist regularly. Denies leg edema, GI symptoms.  Health Maintenance: Immunizations -- Declines tetanus vaccine. UTD on flu vaccine. Colonoscopy -- Cologuard negative 2023. Mammogram -- Last completed 10/14/20. No mammographic evidence of malignancy. Recommended repeat in 2024. PAP -- Last completed 09/01/20. Negative for intraepithelial lesion or malignancy. Recommended repeat in 2025. Bone Density -- N/A Diet -- She is trying to eat healthy. Exercise -- Doing pilates, resistance/weight training daily.  Sleep habits -- Stable. Mood -- Recent period of high stress due to family issues but has stabilized now.  UTD with dentist? - UTD UTD with eye doctor? - UTD  Weight history: Wt Readings from Last 10 Encounters:  06/14/22 198 lb 4 oz (89.9 kg)  04/12/21 185 lb 4 oz (84 kg)  01/21/20 168 lb 6.1 oz (76.4 kg)  06/09/19 181 lb 4 oz (82.2 kg)  07/30/17 188 lb (85.3 kg)  05/20/17 185 lb (83.9 kg)  05/20/17 187 lb (84.8 kg)  05/15/17 185 lb (83.9 kg)  12/25/16 187 lb 8 oz (85 kg)  11/27/16 189 lb (85.7 kg)   Body mass index is 32.99 kg/m. No LMP recorded. (Menstrual status: Oral contraceptives).  Alcohol use:  reports current alcohol use of about 5.0 standard drinks of  alcohol per week.  Tobacco use:  Tobacco Use: Low Risk  (06/14/2022)   Patient History    Smoking Tobacco Use: Never    Smokeless Tobacco Use: Never    Passive Exposure: Not on file   Eligible for lung cancer screening? No     06/14/2022    9:09 AM  Depression screen PHQ 2/9  Decreased Interest 0  Down, Depressed, Hopeless 0  PHQ - 2 Score 0     Other providers/specialists: Patient Care Team: Jarold Motto, [place] as PCP - General (Physician Assistant)    PMHx, SurgHx, SocialHx, Medications, and Allergies were reviewed in the Visit Navigator and updated as appropriate.   Past Medical History:  Diagnosis Date   Allergy    Anxiety    Family history of adverse reaction to anesthesia    Father has PONV   Fibroadenoma of right breast    GERD (gastroesophageal reflux disease)    Headache    Kidney stones 2007   PVC's (premature ventricular contractions)    Hx: of   Umbilical hernia      Past Surgical History:  Procedure Laterality Date   BREAST SURGERY  2017   removal of Fibroadenoma   CESAREAN SECTION     CHOLECYSTECTOMY     CYST EXCISION     from lip as a toddler   DILATION AND CURETTAGE OF UTERUS     EYE SURGERY     LASIK     LITHOTRIPSY     MASS  EXCISION Right 10/28/2015   Procedure: EXCISION OF RIGHT BREAST FIBROADENOMA;  Surgeon: Avel Peace, MD;  Location: [facility] OR;  Service: General;  Laterality: Right;   WISDOM TOOTH EXTRACTION       Family History  Problem Relation Age of Onset   Hypertension Mother    COPD Mother    Depression Mother    Diabetes Mother    Hypertension Father    Alcohol abuse Brother    COPD Brother    Depression Brother    Drug abuse Brother    Hypertension Brother    Learning disabilities Brother    Mental illness Brother    Arthritis Maternal Grandmother    Asthma Maternal Grandmother    Hypertension Maternal Grandmother    Alcohol abuse Maternal Grandfather    Hypertension Maternal Grandfather    Arthritis Paternal  Grandmother    Cancer Paternal Grandmother        unknown type   Early death Paternal Grandmother    Hearing loss Paternal Grandfather    Breast cancer Other     Social History   Tobacco Use   Smoking status: Never   Smokeless tobacco: Never  Vaping Use   Vaping Use: Never used  Substance Use Topics   Alcohol use: Yes    Alcohol/week: 5.0 standard drinks of alcohol    Types: 5 Glasses of wine per week    Comment: daily wine 1-2 glasses   Drug use: No    Review of Systems:   Review of Systems  Constitutional:  Negative for chills, fever, malaise/fatigue and weight loss.  HENT:  Negative for hearing loss, sinus pain and sore throat.   Respiratory:  Negative for cough, hemoptysis and shortness of breath.   Cardiovascular:  Negative for chest pain, palpitations, leg swelling and PND.  Gastrointestinal:  Negative for abdominal pain, constipation, diarrhea, heartburn, nausea and vomiting.  Genitourinary:  Negative for dysuria, frequency and urgency.  Musculoskeletal:  Negative for back pain, myalgias and neck pain.       (+) Pain in bottom of feet  Skin:  Negative for itching and rash.  Neurological:  Negative for dizziness, tingling, seizures and headaches.  Endo/Heme/Allergies:  Negative for polydipsia.  Psychiatric/Behavioral:  Negative for depression. The patient is not nervous/anxious.     Objective:   BP 120/80 (BP Location: Left Arm, Patient Position: Sitting, Cuff Size: Large)   Pulse 88   Temp (!) 97.5 F (36.4 C) (Temporal)   Ht 5\' 5"  (1.651 m)   Wt 198 lb 4 oz (89.9 kg)   SpO2 99%   BMI 32.99 kg/m  Body mass index is 32.99 kg/m.   General Appearance:    Alert, cooperative, no distress, appears stated age  Head:    Normocephalic, without obvious abnormality, atraumatic  Eyes:    PERRL, conjunctiva/corneas clear, EOM's intact, fundi    benign, both eyes  Ears:    Normal TM's and external ear canals, both ears  Nose:   Nares normal, septum midline, mucosa  normal, no drainage    or sinus tenderness  Throat:   Lips, mucosa, and tongue normal; teeth and gums normal  Neck:   Supple, symmetrical, trachea midline, no adenopathy;    thyroid:  no enlargement/tenderness/nodules; no carotid   bruit or JVD  Back:     Symmetric, no curvature, ROM normal, no CVA tenderness  Lungs:     Clear to auscultation bilaterally, respirations unlabored  Chest Wall:    No tenderness or deformity  Heart:    Regular rate and rhythm, S1 and S2 normal, no murmur, rub or gallop  Breast Exam:    Deferred   Abdomen:     Soft, non-tender, bowel sounds active all four quadrants,    no masses, no organomegaly  Genitalia:    Deferred   Extremities:   Extremities normal, atraumatic, no cyanosis or edema  Pulses:   2+ and symmetric all extremities  Skin:   Skin color, texture, turgor normal, no rashes or lesions  Lymph nodes:   Cervical, supraclavicular, and axillary nodes normal  Neurologic:   CNII-XII intact, normal strength, sensation and reflexes    throughout    Assessment/Plan:   Routine physical examination Today patient counseled on age appropriate routine health concerns for screening and prevention, each reviewed and up to date or declined. Immunizations reviewed and up to date or declined. Labs ordered and reviewed. Risk factors for depression reviewed and negative. Hearing function and visual acuity are intact. ADLs screened and addressed as needed. Functional ability and level of safety reviewed and appropriate. Education, counseling and referrals performed based on assessed risks today. Patient provided with a copy of personalized plan for preventive services.  Essential hypertension Well controlled Continue amlodipine 2.5 mg daily Follow-up in 1 year sooner if concerns  Obesity, unspecified classification, unspecified obesity type, unspecified whether serious comorbidity present Continue regular exercise and healthy eating  Anxiety Well  controlled Continue buspar 5 mg twice daily    I,Alexander Ruley,acting as a scribe for [employer], PA.,have documented all relevant documentation on the behalf of Jarold Motto, PA,as directed by  Jarold Motto, PA while in the presence of Jarold Motto, [place].   I, Jarold Motto, [place], have reviewed all documentation for this visit. The documentation on 06/14/22 for the exam, diagnosis, procedures, and orders are all accurate and complete.    Jarold Motto, PA-C [facility]

## 2022-06-12 ENCOUNTER — Encounter: Payer: Self-pay | Admitting: Podiatry

## 2022-06-14 ENCOUNTER — Encounter: Payer: Self-pay | Admitting: Physician Assistant

## 2022-06-14 ENCOUNTER — Ambulatory Visit (INDEPENDENT_AMBULATORY_CARE_PROVIDER_SITE_OTHER): Payer: 59 | Admitting: Physician Assistant

## 2022-06-14 VITALS — BP 120/80 | HR 88 | Temp 97.5°F | Ht 65.0 in | Wt 198.2 lb

## 2022-06-14 DIAGNOSIS — F419 Anxiety disorder, unspecified: Secondary | ICD-10-CM | POA: Diagnosis not present

## 2022-06-14 DIAGNOSIS — Z0000 Encounter for general adult medical examination without abnormal findings: Secondary | ICD-10-CM

## 2022-06-14 DIAGNOSIS — I10 Essential (primary) hypertension: Secondary | ICD-10-CM | POA: Diagnosis not present

## 2022-06-14 DIAGNOSIS — E669 Obesity, unspecified: Secondary | ICD-10-CM

## 2022-06-14 LAB — COMPREHENSIVE METABOLIC PANEL
ALT: 21 U/L (ref 0–35)
AST: 16 U/L (ref 0–37)
Albumin: 4.5 g/dL (ref 3.5–5.2)
Alkaline Phosphatase: 52 U/L (ref 39–117)
BUN: 18 mg/dL (ref 6–23)
CO2: 28 mEq/L (ref 19–32)
Calcium: 9.6 mg/dL (ref 8.4–10.5)
Chloride: 102 mEq/L (ref 96–112)
Creatinine, Ser: 0.85 mg/dL (ref 0.40–1.20)
GFR: 80.91 mL/min (ref >60.00)
Glucose, Bld: 94 mg/dL (ref 70–99)
Potassium: 4 mEq/L (ref 3.5–5.1)
Sodium: 139 mEq/L (ref 135–145)
Total Bilirubin: 0.8 mg/dL (ref 0.2–1.2)
Total Protein: 7.2 g/dL (ref 6.0–8.3)

## 2022-06-14 LAB — LDL CHOLESTEROL, DIRECT: Direct LDL: 151 mg/dL

## 2022-06-14 LAB — CBC WITH DIFFERENTIAL/PLATELET
Basophils Absolute: 0 10*3/uL (ref 0.0–0.1)
Basophils Relative: 0.5 % (ref 0.0–3.0)
Eosinophils Absolute: 0.2 10*3/uL (ref 0.0–0.7)
Eosinophils Relative: 2.3 % (ref 0.0–5.0)
HCT: 41.7 % (ref 36.0–46.0)
Hemoglobin: 14 g/dL (ref 12.0–15.0)
Lymphocytes Relative: 35.8 % (ref 12.0–46.0)
Lymphs Abs: 2.9 10*3/uL (ref 0.7–4.0)
MCHC: 33.6 g/dL (ref 30.0–36.0)
MCV: 97.9 fl (ref 78.0–100.0)
Monocytes Absolute: 0.7 10*3/uL (ref 0.1–1.0)
Monocytes Relative: 8.9 % (ref 3.0–12.0)
Neutro Abs: 4.2 10*3/uL (ref 1.4–7.7)
Neutrophils Relative %: 52.5 % (ref 43.0–77.0)
Platelets: 263 10*3/uL (ref 150.0–400.0)
RBC: 4.25 Mil/uL (ref 3.87–5.11)
RDW: 13 % (ref 11.5–15.5)
WBC: 8.1 10*3/uL (ref 4.0–10.5)

## 2022-06-14 LAB — LIPID PANEL
Cholesterol: 255 mg/dL — ABNORMAL HIGH (ref 0–200)
HDL: 56.6 mg/dL (ref >39.00)
NonHDL: 198.02
Total CHOL/HDL Ratio: 4
Triglycerides: 212 mg/dL — ABNORMAL HIGH (ref 0.0–149.0)
VLDL: 42.4 mg/dL — ABNORMAL HIGH (ref 0.0–40.0)

## 2022-06-14 NOTE — Patient Instructions (Signed)
It was great to see you! ? ?Please go to the lab for blood work.  ? ?Our office will call you with your results unless you have chosen to receive results via MyChart. ? ?If your blood work is normal we will follow-up each year for physicals and as scheduled for chronic medical problems. ? ?If anything is abnormal we will treat accordingly and get you in for a follow-up. ? ?Take care, ? ?[name] ?  ? ? ?

## 2022-06-15 ENCOUNTER — Other Ambulatory Visit: Payer: Self-pay | Admitting: Physician Assistant

## 2022-06-16 ENCOUNTER — Other Ambulatory Visit: Payer: Self-pay

## 2022-06-16 ENCOUNTER — Other Ambulatory Visit (HOSPITAL_BASED_OUTPATIENT_CLINIC_OR_DEPARTMENT_OTHER): Payer: Self-pay

## 2022-06-16 MED ORDER — AMLODIPINE BESYLATE 2.5 MG PO TABS
2.5000 mg | ORAL_TABLET | Freq: Every day | ORAL | 0 refills | Status: DC
  Filled 2022-06-16: qty 30, 30d supply, fill #0

## 2022-06-16 MED ORDER — BUSPIRONE HCL 5 MG PO TABS
5.0000 mg | ORAL_TABLET | Freq: Two times a day (BID) | ORAL | 0 refills | Status: DC
  Filled 2022-06-16: qty 60, 30d supply, fill #0

## 2022-06-19 ENCOUNTER — Other Ambulatory Visit: Payer: Self-pay | Admitting: Podiatry

## 2022-06-19 MED ORDER — METHYLPREDNISOLONE 4 MG PO TBPK: ORAL_TABLET | ORAL | 0 refills | Status: DC

## 2022-06-22 ENCOUNTER — Ambulatory Visit (INDEPENDENT_AMBULATORY_CARE_PROVIDER_SITE_OTHER): Payer: 59

## 2022-06-22 DIAGNOSIS — M778 Other enthesopathies, not elsewhere classified: Secondary | ICD-10-CM

## 2022-06-22 NOTE — Progress Notes (Signed)
Patient presents today to be casted for custom molded orthotics. EVANS is the treating physician.  Impression scan cast was taken. ABN signed.  Patient info-  Shoe size: 8.5  Shoe style: ATHLETIC  Height: 5FT 5IN  Weight: 198  Insurance: [employer]   Patient will contact her insurance to see if inserts will be covered and she will call back.

## 2022-07-15 ENCOUNTER — Other Ambulatory Visit: Payer: Self-pay | Admitting: Physician Assistant

## 2022-07-17 ENCOUNTER — Other Ambulatory Visit (HOSPITAL_BASED_OUTPATIENT_CLINIC_OR_DEPARTMENT_OTHER): Payer: Self-pay

## 2022-07-17 MED ORDER — AMLODIPINE BESYLATE 2.5 MG PO TABS
2.5000 mg | ORAL_TABLET | Freq: Every day | ORAL | 0 refills | Status: DC
  Filled 2022-07-24: qty 30, 30d supply, fill #0

## 2022-07-17 MED ORDER — BUSPIRONE HCL 5 MG PO TABS
5.0000 mg | ORAL_TABLET | Freq: Two times a day (BID) | ORAL | 0 refills | Status: DC
  Filled 2022-07-24: qty 60, 30d supply, fill #0

## 2022-07-24 ENCOUNTER — Other Ambulatory Visit: Payer: Self-pay

## 2022-07-24 ENCOUNTER — Other Ambulatory Visit (HOSPITAL_BASED_OUTPATIENT_CLINIC_OR_DEPARTMENT_OTHER): Payer: Self-pay

## 2022-08-01 ENCOUNTER — Other Ambulatory Visit (HOSPITAL_BASED_OUTPATIENT_CLINIC_OR_DEPARTMENT_OTHER): Payer: Self-pay

## 2022-08-01 ENCOUNTER — Other Ambulatory Visit: Payer: Self-pay | Admitting: Physician Assistant

## 2022-08-01 ENCOUNTER — Other Ambulatory Visit: Payer: Self-pay

## 2022-08-01 MED ORDER — BUSPIRONE HCL 5 MG PO TABS
5.0000 mg | ORAL_TABLET | Freq: Two times a day (BID) | ORAL | 0 refills | Status: DC
  Filled 2022-08-01: qty 60, 30d supply, fill #0

## 2022-08-02 ENCOUNTER — Ambulatory Visit (INDEPENDENT_AMBULATORY_CARE_PROVIDER_SITE_OTHER): Payer: 59 | Admitting: Podiatry

## 2022-08-02 DIAGNOSIS — M19071 Primary osteoarthritis, right ankle and foot: Secondary | ICD-10-CM

## 2022-08-02 DIAGNOSIS — M778 Other enthesopathies, not elsewhere classified: Secondary | ICD-10-CM

## 2022-08-02 NOTE — Progress Notes (Signed)
Patient presents today to pick up custom molded foot orthotics recommended by Dr. Evans.   Orthotics were dispensed and fit was satisfactory. Reviewed instructions for break-in and wear. Written instructions given to patient.  Patient will follow up as needed.   

## 2022-08-22 ENCOUNTER — Other Ambulatory Visit (HOSPITAL_BASED_OUTPATIENT_CLINIC_OR_DEPARTMENT_OTHER): Payer: Self-pay

## 2022-08-22 ENCOUNTER — Other Ambulatory Visit: Payer: Self-pay | Admitting: Physician Assistant

## 2022-08-22 MED ORDER — AMLODIPINE BESYLATE 2.5 MG PO TABS
2.5000 mg | ORAL_TABLET | Freq: Every day | ORAL | 5 refills | Status: DC
  Filled 2022-08-22: qty 30, 30d supply, fill #0
  Filled 2022-10-03: qty 30, 30d supply, fill #1
  Filled 2022-10-24 – 2022-10-27 (×2): qty 30, 30d supply, fill #2
  Filled 2022-11-27: qty 30, 30d supply, fill #3
  Filled 2022-11-29 – 2022-12-27 (×3): qty 30, 30d supply, fill #4
  Filled 2023-01-18 – 2023-01-21 (×2): qty 30, 30d supply, fill #5

## 2022-08-22 MED ORDER — BUSPIRONE HCL 5 MG PO TABS
5.0000 mg | ORAL_TABLET | Freq: Two times a day (BID) | ORAL | 5 refills | Status: DC
  Filled 2022-08-22 – 2022-08-25 (×2): qty 60, 30d supply, fill #0
  Filled 2022-10-03: qty 60, 30d supply, fill #1
  Filled 2022-10-28: qty 60, 30d supply, fill #2
  Filled 2022-11-27: qty 60, 30d supply, fill #3
  Filled 2022-12-07 – 2023-01-18 (×2): qty 60, 30d supply, fill #4
  Filled 2023-02-01 – 2023-02-12 (×2): qty 60, 30d supply, fill #5

## 2022-08-25 ENCOUNTER — Other Ambulatory Visit (HOSPITAL_BASED_OUTPATIENT_CLINIC_OR_DEPARTMENT_OTHER): Payer: Self-pay

## 2022-08-25 ENCOUNTER — Other Ambulatory Visit: Payer: Self-pay

## 2022-10-24 ENCOUNTER — Other Ambulatory Visit (HOSPITAL_BASED_OUTPATIENT_CLINIC_OR_DEPARTMENT_OTHER): Payer: Self-pay

## 2022-10-25 ENCOUNTER — Other Ambulatory Visit (HOSPITAL_BASED_OUTPATIENT_CLINIC_OR_DEPARTMENT_OTHER): Payer: Self-pay

## 2022-10-25 MED ORDER — NORETHINDRONE 0.35 MG PO TABS
1.0000 | ORAL_TABLET | Freq: Every day | ORAL | 0 refills | Status: DC
  Filled 2022-10-25: qty 28, 28d supply, fill #0

## 2022-11-02 ENCOUNTER — Other Ambulatory Visit (HOSPITAL_BASED_OUTPATIENT_CLINIC_OR_DEPARTMENT_OTHER): Payer: Self-pay

## 2022-11-02 DIAGNOSIS — Z01419 Encounter for gynecological examination (general) (routine) without abnormal findings: Secondary | ICD-10-CM | POA: Diagnosis not present

## 2022-11-02 DIAGNOSIS — N951 Menopausal and female climacteric states: Secondary | ICD-10-CM | POA: Insufficient documentation

## 2022-11-02 DIAGNOSIS — Z1231 Encounter for screening mammogram for malignant neoplasm of breast: Secondary | ICD-10-CM | POA: Diagnosis not present

## 2022-11-02 LAB — HM MAMMOGRAPHY

## 2022-11-02 MED ORDER — NORETHINDRONE-ETH ESTRADIOL 1-5 MG-MCG PO TABS
1.0000 | ORAL_TABLET | Freq: Every day | ORAL | 4 refills | Status: DC
  Filled 2022-11-02 – 2022-12-19 (×2): qty 84, 84d supply, fill #0

## 2022-11-07 ENCOUNTER — Other Ambulatory Visit (HOSPITAL_BASED_OUTPATIENT_CLINIC_OR_DEPARTMENT_OTHER): Payer: Self-pay

## 2022-11-09 ENCOUNTER — Encounter: Payer: Self-pay | Admitting: Physician Assistant

## 2022-11-13 ENCOUNTER — Other Ambulatory Visit (HOSPITAL_BASED_OUTPATIENT_CLINIC_OR_DEPARTMENT_OTHER): Payer: Self-pay

## 2022-11-14 ENCOUNTER — Other Ambulatory Visit (HOSPITAL_BASED_OUTPATIENT_CLINIC_OR_DEPARTMENT_OTHER): Payer: Self-pay

## 2022-11-27 ENCOUNTER — Other Ambulatory Visit: Payer: Self-pay

## 2022-11-27 ENCOUNTER — Other Ambulatory Visit (HOSPITAL_BASED_OUTPATIENT_CLINIC_OR_DEPARTMENT_OTHER): Payer: Self-pay

## 2022-11-27 MED ORDER — PANTOPRAZOLE SODIUM 40 MG PO TBEC
40.0000 mg | DELAYED_RELEASE_TABLET | Freq: Every day | ORAL | 4 refills | Status: DC
  Filled 2022-11-27: qty 90, 90d supply, fill #0
  Filled 2022-12-07 – 2022-12-18 (×2): qty 90, 90d supply, fill #1
  Filled 2022-12-19: qty 60, 60d supply, fill #1
  Filled 2022-12-19: qty 90, 90d supply, fill #1
  Filled 2023-02-01 – 2023-02-08 (×2): qty 60, 60d supply, fill #2
  Filled 2023-06-18: qty 60, 60d supply, fill #3
  Filled 2023-07-13 – 2023-08-20 (×2): qty 60, 60d supply, fill #4
  Filled 2023-09-19 – 2023-10-18 (×3): qty 60, 60d supply, fill #5
  Filled 2023-11-16: qty 60, 60d supply, fill #6

## 2022-11-27 MED ORDER — NORETHINDRONE 0.35 MG PO TABS
1.0000 | ORAL_TABLET | Freq: Every day | ORAL | 0 refills | Status: DC
  Filled 2022-11-27: qty 28, 28d supply, fill #0

## 2022-11-29 ENCOUNTER — Other Ambulatory Visit (HOSPITAL_BASED_OUTPATIENT_CLINIC_OR_DEPARTMENT_OTHER): Payer: Self-pay

## 2022-12-07 ENCOUNTER — Other Ambulatory Visit (HOSPITAL_BASED_OUTPATIENT_CLINIC_OR_DEPARTMENT_OTHER): Payer: Self-pay

## 2022-12-07 ENCOUNTER — Ambulatory Visit: Payer: 59 | Admitting: Physician Assistant

## 2022-12-07 VITALS — BP 122/80 | HR 95 | Temp 97.8°F | Ht 65.0 in | Wt 203.2 lb

## 2022-12-07 DIAGNOSIS — E785 Hyperlipidemia, unspecified: Secondary | ICD-10-CM | POA: Diagnosis not present

## 2022-12-07 DIAGNOSIS — F419 Anxiety disorder, unspecified: Secondary | ICD-10-CM | POA: Diagnosis not present

## 2022-12-07 DIAGNOSIS — I10 Essential (primary) hypertension: Secondary | ICD-10-CM

## 2022-12-07 LAB — LIPID PANEL
Cholesterol: 266 mg/dL — ABNORMAL HIGH (ref 0–200)
HDL: 53.1 mg/dL (ref >39.00)
LDL Cholesterol: 176 mg/dL — ABNORMAL HIGH (ref 0–99)
NonHDL: 212.44
Total CHOL/HDL Ratio: 5
Triglycerides: 183 mg/dL — ABNORMAL HIGH (ref 0.0–149.0)
VLDL: 36.6 mg/dL (ref 0.0–40.0)

## 2022-12-07 MED ORDER — NORETHINDRONE 0.35 MG PO TABS
1.0000 | ORAL_TABLET | Freq: Every day | ORAL | 4 refills | Status: DC
  Filled 2022-12-07 – 2022-12-19 (×3): qty 84, 84d supply, fill #0
  Filled 2023-03-01: qty 84, 84d supply, fill #1
  Filled 2023-04-18 – 2023-06-07 (×2): qty 84, 84d supply, fill #2

## 2022-12-07 NOTE — Progress Notes (Signed)
Andrea Gray is a 47 y.o. female here for a follow-up of a pre-existing problem.  History of Present Illness:   Chief Complaint  Patient presents with   Hyperlipidemia   Hypertension   HPI  Hyperlipidemia: Reports she is here to follow-up for her cholesterol. Endorses fasting (some black coffee). Lab Results  Component Value Date   CHOL 255 (H) 06/14/2022   HDL 56.60 06/14/2022   LDLCALC 159 (H) 06/09/2019   LDLDIRECT 151.0 06/14/2022   TRIG 212.0 (H) 06/14/2022   CHOLHDL 4 06/14/2022   Hypertension: Managed/Compliant with 2.5 mg Amlodipine.  Not monitored at home as she hasn't needed to.  Denies excessive caffeine intake, stimulant usage, excessive alcohol intake, or increase in salt consumption.  Denies chest pain, shortness of breath, blurred vision, dizziness, unusual headaches, or lower leg swelling.  BP Readings from Last 3 Encounters:  12/07/22 122/80  06/14/22 120/80  04/12/21 130/80   Anxiety: Well managed with 5 mg Buspar. Well-controlled  Past Medical History:  Diagnosis Date   Allergy    Anxiety    Family history of adverse reaction to anesthesia    Father has PONV   Fibroadenoma of right breast    GERD (gastroesophageal reflux disease)    Headache    Kidney stones 2007   PVC's (premature ventricular contractions)    Hx: of   Umbilical hernia     Social History   Tobacco Use   Smoking status: Never   Smokeless tobacco: Never  Vaping Use   Vaping status: Never Used  Substance Use Topics   Alcohol use: Yes    Alcohol/week: 5.0 standard drinks of alcohol    Types: 5 Glasses of wine per week    Comment: daily wine 1-2 glasses   Drug use: No   Past Surgical History:  Procedure Laterality Date   BREAST SURGERY  2017   removal of Fibroadenoma   CESAREAN SECTION     CHOLECYSTECTOMY     CYST EXCISION     from lip as a toddler   DILATION AND CURETTAGE OF UTERUS     EYE SURGERY     LASIK     LITHOTRIPSY     MASS EXCISION Right 10/28/2015    Procedure: EXCISION OF RIGHT BREAST FIBROADENOMA;  Surgeon: Avel Peace, MD;  Location: [facility] OR;  Service: General;  Laterality: Right;   WISDOM TOOTH EXTRACTION     Family History  Problem Relation Age of Onset   Hypertension Mother    COPD Mother    Depression Mother    Diabetes Mother    Hypertension Father    Alcohol abuse Brother    COPD Brother    Depression Brother    Drug abuse Brother    Hypertension Brother    Learning disabilities Brother    Mental illness Brother    Arthritis Maternal Grandmother    Asthma Maternal Grandmother    Hypertension Maternal Grandmother    Alcohol abuse Maternal Grandfather    Hypertension Maternal Grandfather    Arthritis Paternal Grandmother    Cancer Paternal Grandmother        unknown type   Early death Paternal Grandmother    Hearing loss Paternal Grandfather    Breast cancer Other    Allergies  Allergen Reactions   Penicillins     Has patient had a PCN reaction causing immediate rash, facial/tongue/throat swelling, SOB or lightheadedness with hypotension: Yes Has patient had a PCN reaction causing severe rash involving mucus membranes or  skin necrosis: No Has patient had a PCN reaction that required hospitalization Yes Has patient had a PCN reaction occurring within the last 10 years: No If all of the above answers are "NO", then may proceed with Cephalosporin use.     Current Medications:   Current Outpatient Medications:    amLODipine (NORVASC) 2.5 MG tablet, Take 1 tablet (2.5 mg total) by mouth daily., Disp: 30 tablet, Rfl: 5   B Complex Vitamins (B COMPLEX PO), Take 1 tablet by mouth daily in the afternoon., Disp: , Rfl:    busPIRone (BUSPAR) 5 MG tablet, Take 1 tablet (5 mg total) by mouth 2 (two) times daily. Needs appointment., Disp: 60 tablet, Rfl: 5   clobetasol cream (TEMOVATE) 0.05 %, Apply topically to skin twice daily in the morning and evening.  Maximum use is 2 weeks (Patient taking differently: Apply 1  Application topically as needed.), Disp: 60 g, Rfl: 1   Collagen Hydrolysate POWD, 1 Scoop by Does not apply route daily in the afternoon., Disp: , Rfl:    fluocinonide (LIDEX) 0.05 % external solution, Apply topically to affected area of skin twice a day. (may use 2-4 times daily), Disp: 180 mL, Rfl: 5   fluticasone (FLONASE) 50 MCG/ACT nasal spray, Place 2 sprays into both nostrils daily., Disp: 16 g, Rfl: 6   ibuprofen (ADVIL,MOTRIN) 200 MG tablet, Take 400 mg by mouth every 6 (six) hours as needed for mild pain., Disp: , Rfl:    ketoconazole (NIZORAL) 2 % shampoo, Apply topically 2 (two) times a week. Shampoo hair thoroughly, Disp: 360 mL, Rfl: 12   Multiple Vitamin (MULTIVITAMIN) tablet, Take 1 tablet by mouth daily., Disp: 90 tablet, Rfl: 3   norethindrone-ethinyl estradiol (JINTELI) 1-5 MG-MCG TABS tablet, Take 1 tablet by mouth daily., Disp: 84 tablet, Rfl: 4   pantoprazole (PROTONIX) 40 MG tablet, Take 1 tablet (40 mg total) by mouth daily., Disp: 95 tablet, Rfl: 4   triamcinolone cream (KENALOG) 0.1 %, Apply topically 2 (two) times daily., Disp: 454 g, Rfl: 1   norethindrone (INCASSIA) 0.35 MG tablet, Take 1 tablet (0.35 mg total) by mouth daily. (Patient not taking: Reported on 12/07/2022), Disp: 28 tablet, Rfl: 0  Review of Systems:   ROS See pertinent positives and negatives as per the HPI.  Vitals:   Vitals:   12/07/22 1013  BP: 122/80  Pulse: 95  Temp: 97.8 F (36.6 C)  TempSrc: Temporal  SpO2: 99%  Weight: 203 lb 4 oz (92.2 kg)  Height: 5\' 5"  (1.651 m)     Body mass index is 33.82 kg/m.  Physical Exam:   Physical Exam Vitals and nursing note reviewed.  Constitutional:      General: She is not in acute distress.    Appearance: She is well-developed. She is not ill-appearing or toxic-appearing.  Cardiovascular:     Rate and Rhythm: Normal rate and regular rhythm.     Pulses: Normal pulses.     Heart sounds: Normal heart sounds, S1 normal and S2 normal.   Pulmonary:     Effort: Pulmonary effort is normal.     Breath sounds: Normal breath sounds.  Skin:    General: Skin is warm and dry.  Neurological:     Mental Status: She is alert.     GCS: GCS eye subscore is 4. GCS verbal subscore is 5. GCS motor subscore is 6.  Psychiatric:        Speech: Speech normal.  Behavior: Behavior normal. Behavior is cooperative.     Assessment and Plan:   Essential hypertension Normotensive Continue amlodipine 2.5 mg daily Follow-up in 1 year, sooner if concerns  Hyperlipidemia, unspecified hyperlipidemia type Update lipid panel Briefly discussed calcium score -- she will consider this and let me know if she would like to pursue this  Anxiety Well controlled Continue buspar 5 mg twice daily for anxiety Denies suicidal ideation/hi   I,Emily Lagle,acting as a scribe for [employer], PA.,have documented all relevant documentation on the behalf of Jarold Motto, PA,as directed by  Jarold Motto, PA while in the presence of Jarold Motto, [place].  I, Jarold Motto, [place], have reviewed all documentation for this visit. The documentation on 12/07/22 for the exam, diagnosis, procedures, and orders are all accurate and complete.  Jarold Motto, PA-C

## 2022-12-08 ENCOUNTER — Other Ambulatory Visit (HOSPITAL_BASED_OUTPATIENT_CLINIC_OR_DEPARTMENT_OTHER): Payer: Self-pay

## 2022-12-18 ENCOUNTER — Other Ambulatory Visit (HOSPITAL_BASED_OUTPATIENT_CLINIC_OR_DEPARTMENT_OTHER): Payer: Self-pay

## 2022-12-19 ENCOUNTER — Other Ambulatory Visit (HOSPITAL_BASED_OUTPATIENT_CLINIC_OR_DEPARTMENT_OTHER): Payer: Self-pay

## 2022-12-21 ENCOUNTER — Encounter: Payer: Self-pay | Admitting: Physician Assistant

## 2022-12-21 DIAGNOSIS — E785 Hyperlipidemia, unspecified: Secondary | ICD-10-CM

## 2023-01-18 ENCOUNTER — Other Ambulatory Visit: Payer: Self-pay

## 2023-01-19 ENCOUNTER — Other Ambulatory Visit (HOSPITAL_BASED_OUTPATIENT_CLINIC_OR_DEPARTMENT_OTHER): Payer: Self-pay

## 2023-01-22 ENCOUNTER — Other Ambulatory Visit: Payer: Self-pay

## 2023-01-26 ENCOUNTER — Ambulatory Visit: Payer: 59 | Admitting: Physician Assistant

## 2023-01-26 ENCOUNTER — Other Ambulatory Visit (HOSPITAL_BASED_OUTPATIENT_CLINIC_OR_DEPARTMENT_OTHER): Payer: Self-pay

## 2023-01-26 ENCOUNTER — Encounter: Payer: Self-pay | Admitting: Physician Assistant

## 2023-01-26 VITALS — BP 124/78 | HR 96 | Temp 97.5°F | Ht 64.0 in | Wt 205.4 lb

## 2023-01-26 DIAGNOSIS — R0981 Nasal congestion: Secondary | ICD-10-CM

## 2023-01-26 MED ORDER — DOXYCYCLINE HYCLATE 100 MG PO TABS
100.0000 mg | ORAL_TABLET | Freq: Two times a day (BID) | ORAL | 0 refills | Status: DC
  Filled 2023-01-26: qty 20, 10d supply, fill #0

## 2023-01-26 NOTE — Progress Notes (Signed)
Andrea Gray is a 47 y.o. female here for a follow up of a pre-existing problem.  History of Present Illness:   Chief Complaint  Patient presents with   Sinus Problem    Sinus pressure     HPI  Sinus infection: Pt complains of congestion, sinus pressure, and chills starting around Thanksgiving (11/28). She endorses recent blood in her mucus when blowing her nose.  Her chills have since resolved and were only present at onset of symptoms.  Previously had a sore throat before onset of symptoms and notes it was "going around" her house.  Has used flonase and ibuprofen to relieve her symptoms.  Using flonase at nighttime to help improve her breathing throughout the night, reports no nasal dryness.  Denies any fever. No ear pressure, popping or pain.  Has not recently been on an antibiotic.   Past Medical History:  Diagnosis Date   Allergy    Anxiety    Family history of adverse reaction to anesthesia    Father has PONV   Fibroadenoma of right breast    GERD (gastroesophageal reflux disease)    Headache    Kidney stones 2007   PVC's (premature ventricular contractions)    Hx: of   Umbilical hernia      Social History   Tobacco Use   Smoking status: Never   Smokeless tobacco: Never  Vaping Use   Vaping status: Never Used  Substance Use Topics   Alcohol use: Yes    Alcohol/week: 5.0 standard drinks of alcohol    Types: 5 Glasses of wine per week    Comment: daily wine 1-2 glasses   Drug use: No    Past Surgical History:  Procedure Laterality Date   BREAST SURGERY  2017   removal of Fibroadenoma   CESAREAN SECTION     CHOLECYSTECTOMY     CYST EXCISION     from lip as a toddler   DILATION AND CURETTAGE OF UTERUS     EYE SURGERY     LASIK     LITHOTRIPSY     MASS EXCISION Right 10/28/2015   Procedure: EXCISION OF RIGHT BREAST FIBROADENOMA;  Surgeon: Avel Peace, MD;  Location: [facility] OR;  Service: General;  Laterality: Right;   WISDOM TOOTH EXTRACTION       Family History  Problem Relation Age of Onset   Hypertension Mother    COPD Mother    Depression Mother    Diabetes Mother    Hypertension Father    Alcohol abuse Brother    COPD Brother    Depression Brother    Drug abuse Brother    Hypertension Brother    Learning disabilities Brother    Mental illness Brother    Arthritis Maternal Grandmother    Asthma Maternal Grandmother    Hypertension Maternal Grandmother    Alcohol abuse Maternal Grandfather    Hypertension Maternal Grandfather    Arthritis Paternal Grandmother    Cancer Paternal Grandmother        unknown type   Early death Paternal Grandmother    Hearing loss Paternal Grandfather    Breast cancer Other     Allergies  Allergen Reactions   Penicillins     Has patient had a PCN reaction causing immediate rash, facial/tongue/throat swelling, SOB or lightheadedness with hypotension: Yes Has patient had a PCN reaction causing severe rash involving mucus membranes or skin necrosis: No Has patient had a PCN reaction that required hospitalization Yes Has patient had  a PCN reaction occurring within the last 10 years: No If all of the above answers are "NO", then may proceed with Cephalosporin use.      Current Medications:   Current Outpatient Medications:    amLODipine (NORVASC) 2.5 MG tablet, Take 1 tablet (2.5 mg total) by mouth daily., Disp: 30 tablet, Rfl: 5   B Complex Vitamins (B COMPLEX PO), Take 1 tablet by mouth daily in the afternoon., Disp: , Rfl:    busPIRone (BUSPAR) 5 MG tablet, Take 1 tablet (5 mg total) by mouth 2 (two) times daily. Needs appointment., Disp: 60 tablet, Rfl: 5   clobetasol cream (TEMOVATE) 0.05 %, Apply topically to skin twice daily in the morning and evening.  Maximum use is 2 weeks (Patient taking differently: Apply 1 Application topically as needed.), Disp: 60 g, Rfl: 1   Collagen Hydrolysate POWD, 1 Scoop by Does not apply route daily in the afternoon., Disp: , Rfl:     fluocinonide (LIDEX) 0.05 % external solution, Apply topically to affected area of skin twice a day. (may use 2-4 times daily), Disp: 180 mL, Rfl: 5   fluticasone (FLONASE) 50 MCG/ACT nasal spray, Place 2 sprays into both nostrils daily., Disp: 16 g, Rfl: 6   ibuprofen (ADVIL,MOTRIN) 200 MG tablet, Take 400 mg by mouth every 6 (six) hours as needed for mild pain., Disp: , Rfl:    ketoconazole (NIZORAL) 2 % shampoo, Apply topically 2 (two) times a week. Shampoo hair thoroughly, Disp: 360 mL, Rfl: 12   Multiple Vitamin (MULTIVITAMIN) tablet, Take 1 tablet by mouth daily., Disp: 90 tablet, Rfl: 3   norethindrone (INCASSIA) 0.35 MG tablet, Take 1 tablet (0.35 mg total) by mouth daily., Disp: 84 tablet, Rfl: 4   norethindrone-ethinyl estradiol (JINTELI) 1-5 MG-MCG TABS tablet, Take 1 tablet by mouth daily., Disp: 84 tablet, Rfl: 4   pantoprazole (PROTONIX) 40 MG tablet, Take 1 tablet (40 mg total) by mouth daily., Disp: 95 tablet, Rfl: 4   triamcinolone cream (KENALOG) 0.1 %, Apply topically 2 (two) times daily., Disp: 454 g, Rfl: 1   Review of Systems:   ROS Negative unless otherwise specified per HPI.  Vitals:   Vitals:   01/26/23 0847  BP: 124/78  Pulse: 96  Temp: (!) 97.5 F (36.4 C)  SpO2: 97%  Weight: 205 lb 6.4 oz (93.2 kg)  Height: 5\' 4"  (1.626 m)     Body mass index is 35.26 kg/m.  Physical Exam:   Physical Exam Vitals and nursing note reviewed.  Constitutional:      General: She is not in acute distress.    Appearance: She is well-developed. She is not ill-appearing or toxic-appearing.  HENT:     Head: Normocephalic and atraumatic.     Right Ear: Tympanic membrane, ear canal and external ear normal. Tympanic membrane is not erythematous, retracted or bulging.     Left Ear: Tympanic membrane, ear canal and external ear normal. Tympanic membrane is not erythematous, retracted or bulging.     Nose:     Right Sinus: Maxillary sinus tenderness and frontal sinus tenderness  present.     Left Sinus: Maxillary sinus tenderness and frontal sinus tenderness present.     Mouth/Throat:     Pharynx: Uvula midline. No posterior oropharyngeal erythema.  Eyes:     General: Lids are normal.     Conjunctiva/sclera: Conjunctivae normal.  Neck:     Trachea: Trachea normal.  Cardiovascular:     Rate and Rhythm: Normal rate and  regular rhythm.     Heart sounds: Normal heart sounds, S1 normal and S2 normal.  Pulmonary:     Effort: Pulmonary effort is normal.     Breath sounds: Normal breath sounds. No decreased breath sounds, wheezing, rhonchi or rales.  Lymphadenopathy:     Cervical: No cervical adenopathy.  Skin:    General: Skin is warm and dry.  Neurological:     Mental Status: She is alert.  Psychiatric:        Speech: Speech normal.        Behavior: Behavior normal. Behavior is cooperative.     Assessment and Plan:   Sinus congestion No red flags on exam.   Will initiate doxycycline 100 mg twice daily per orders.  Discussed taking medications as prescribed.  Reviewed return precautions including new or worsening fever, SOB, new or worsening cough or other concerns.  Push fluids and rest.  I recommend that patient follow-up if symptoms worsen or persist despite treatment x 7-10 days, sooner if needed.  I, Isabelle Course, acting as a Neurosurgeon for Jarold Motto, [place]., have documented all relevant documentation on the behalf of Jarold Motto, [place], as directed by  Jarold Motto, PA while in the presence of Jarold Motto, [place].  I, Isabelle Course, have reviewed all documentation for this visit. The documentation on 01/26/23 for the exam, diagnosis, procedures, and orders are all accurate and complete.  Jarold Motto, PA-C

## 2023-02-01 ENCOUNTER — Other Ambulatory Visit (HOSPITAL_BASED_OUTPATIENT_CLINIC_OR_DEPARTMENT_OTHER): Payer: Self-pay

## 2023-02-01 ENCOUNTER — Other Ambulatory Visit: Payer: Self-pay | Admitting: Physician Assistant

## 2023-02-01 MED ORDER — AMLODIPINE BESYLATE 2.5 MG PO TABS
2.5000 mg | ORAL_TABLET | Freq: Every day | ORAL | 5 refills | Status: DC
  Filled 2023-02-01 – 2023-03-01 (×2): qty 30, 30d supply, fill #0
  Filled 2023-03-21 – 2023-03-26 (×2): qty 30, 30d supply, fill #1
  Filled 2023-04-18 – 2023-04-19 (×2): qty 30, 30d supply, fill #2
  Filled 2023-05-18: qty 30, 30d supply, fill #3
  Filled 2023-06-18: qty 30, 30d supply, fill #4
  Filled 2023-07-13: qty 30, 30d supply, fill #5

## 2023-02-04 ENCOUNTER — Other Ambulatory Visit (HOSPITAL_BASED_OUTPATIENT_CLINIC_OR_DEPARTMENT_OTHER): Payer: Self-pay

## 2023-02-08 ENCOUNTER — Ambulatory Visit (HOSPITAL_BASED_OUTPATIENT_CLINIC_OR_DEPARTMENT_OTHER)
Admission: RE | Admit: 2023-02-08 | Discharge: 2023-02-08 | Disposition: A | Discharge status: Home / Self Care | Payer: Self-pay | Source: Ambulatory Visit | Attending: Physician Assistant | Admitting: Physician Assistant

## 2023-02-08 DIAGNOSIS — E785 Hyperlipidemia, unspecified: Secondary | ICD-10-CM | POA: Insufficient documentation

## 2023-03-01 ENCOUNTER — Other Ambulatory Visit (HOSPITAL_BASED_OUTPATIENT_CLINIC_OR_DEPARTMENT_OTHER): Payer: Self-pay

## 2023-03-21 ENCOUNTER — Other Ambulatory Visit: Payer: Self-pay | Admitting: Physician Assistant

## 2023-03-21 ENCOUNTER — Other Ambulatory Visit: Payer: Self-pay

## 2023-03-21 ENCOUNTER — Other Ambulatory Visit (HOSPITAL_BASED_OUTPATIENT_CLINIC_OR_DEPARTMENT_OTHER): Payer: Self-pay

## 2023-03-21 MED ORDER — BUSPIRONE HCL 5 MG PO TABS
5.0000 mg | ORAL_TABLET | Freq: Two times a day (BID) | ORAL | 5 refills | Status: DC
  Filled 2023-03-21: qty 60, 30d supply, fill #0
  Filled 2023-04-18: qty 60, 30d supply, fill #1
  Filled 2023-05-18: qty 60, 30d supply, fill #2
  Filled 2023-06-18: qty 60, 30d supply, fill #3
  Filled 2023-07-13: qty 60, 30d supply, fill #4
  Filled 2023-08-20: qty 60, 30d supply, fill #5

## 2023-03-26 ENCOUNTER — Other Ambulatory Visit (HOSPITAL_BASED_OUTPATIENT_CLINIC_OR_DEPARTMENT_OTHER): Payer: Self-pay

## 2023-04-04 ENCOUNTER — Telehealth: Payer: 59 | Admitting: Physician Assistant

## 2023-04-04 ENCOUNTER — Other Ambulatory Visit (HOSPITAL_BASED_OUTPATIENT_CLINIC_OR_DEPARTMENT_OTHER): Payer: Self-pay

## 2023-04-04 ENCOUNTER — Encounter: Payer: Self-pay | Admitting: Physician Assistant

## 2023-04-04 VITALS — Ht 64.0 in | Wt 200.0 lb

## 2023-04-04 DIAGNOSIS — M25512 Pain in left shoulder: Secondary | ICD-10-CM

## 2023-04-04 MED ORDER — PREDNISONE 20 MG PO TABS
40.0000 mg | ORAL_TABLET | Freq: Every day | ORAL | 0 refills | Status: DC
  Filled 2023-04-04: qty 10, 5d supply, fill #0

## 2023-04-04 MED ORDER — CYCLOBENZAPRINE HCL 5 MG PO TABS
5.0000 mg | ORAL_TABLET | Freq: Three times a day (TID) | ORAL | 1 refills | Status: DC | PRN
  Filled 2023-04-04: qty 30, 10d supply, fill #0

## 2023-04-04 NOTE — Progress Notes (Signed)
I acted as a Neurosurgeon for [employer], PA-C Corky Mull, LPN  Virtual Visit via Video Note   I, Jarold Motto, PA connected with  Andrea Gray  ([id number], Jan 26, 1976) on 04/04/23 at 10:40 AM EST by a video-enabled telemedicine application and verified that I am speaking with the correct person using two identifiers.  Location: Patient: Home Provider: [facility] [place] [facility] office   I discussed the limitations of evaluation and management by telemedicine and the availability of in person appointments. The patient expressed understanding and agreed to proceed.    History of Present Illness: Andrea Gray is a 48 y.o. who identifies as a female who was assigned female at birth, and is being seen today for Neck pain.   Pt c/o neck pain radiating into left shoulder x 7 days.  Pt thinks it is from lifting her father off the floor.  She has been doing [employer], massage, heating pad.  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)  Denies: chest pain, shortness of breath, jaw pain    Problems:  Patient Active Problem List   Diagnosis Date Noted   Menopausal syndrome 11/02/2022   Anxiety 06/09/2019   Hypertension 11/27/2016    Allergies:  Allergies  Allergen Reactions   Penicillins     Has patient had a PCN reaction causing immediate rash, facial/tongue/throat swelling, SOB or lightheadedness with hypotension: Yes Has patient had a PCN reaction causing severe rash involving mucus membranes or skin necrosis: No Has patient had a PCN reaction that required hospitalization Yes Has patient had a PCN reaction occurring within the last 10 years: No If all of the above answers are "NO", then may proceed with Cephalosporin use.     Medications:  Current Outpatient Medications:    amLODipine (NORVASC) 2.5 MG tablet, Take 1 tablet (2.5 mg total) by mouth daily., Disp: 30 tablet, Rfl: 5   B Complex Vitamins (B COMPLEX PO), Take 1  tablet by mouth daily in the afternoon., Disp: , Rfl:    busPIRone (BUSPAR) 5 MG tablet, Take 1 tablet (5 mg total) by mouth 2 (two) times daily. Needs appointment., Disp: 60 tablet, Rfl: 5   clobetasol cream (TEMOVATE) 0.05 %, Apply topically to skin twice daily in the morning and evening.  Maximum use is 2 weeks (Patient taking differently: Apply 1 Application topically as needed.), Disp: 60 g, Rfl: 1   Collagen Hydrolysate POWD, 1 Scoop by Does not apply route daily in the afternoon., Disp: , Rfl:    cyclobenzaprine (FLEXERIL) 5 MG tablet, Take 1-2 tablets (5-10 mg total) by mouth 3 (three) times daily as needed for muscle spasms., Disp: 30 tablet, Rfl: 1   fluticasone (FLONASE) 50 MCG/ACT nasal spray, Place 2 sprays into both nostrils daily., Disp: 16 g, Rfl: 6   ibuprofen (ADVIL,MOTRIN) 200 MG tablet, Take 400 mg by mouth every 6 (six) hours as needed for mild pain., Disp: , Rfl:    Multiple Vitamin (MULTIVITAMIN) tablet, Take 1 tablet by mouth daily., Disp: 90 tablet, Rfl: 3   norethindrone (INCASSIA) 0.35 MG tablet, Take 1 tablet (0.35 mg total) by mouth daily., Disp: 84 tablet, Rfl: 4   pantoprazole (PROTONIX) 40 MG tablet, Take 1 tablet (40 mg total) by mouth daily., Disp: 95 tablet, Rfl: 4   predniSONE (DELTASONE) 20 MG tablet, Take 2 tablets (40 mg total) by mouth daily with breakfast., Disp: 10 tablet, Rfl: 0   triamcinolone cream (KENALOG) 0.1 %, Apply topically 2 (  two) times daily., Disp: 454 g, Rfl: 1  Observations/Objective: Patient is well-developed, well-nourished in no acute distress.  Resting comfortably  at home.  Head is normocephalic, atraumatic.  No labored breathing.  Speech is clear and coherent with logical content.  Patient is alert and oriented at baseline.   Assessment and Plan: 1. Acute pain of left shoulder (Primary) No red flags Will trial prednisone and flexeril -- sleepy precautions advised If no improvement, will refer to physical therapy vs sports  medicine based on symptom(s) or results If severe symptom(s) or development of cardiac symptom(s), recommend ER evaluation  Follow Up Instructions: I discussed the assessment and treatment plan with the patient. The patient was provided an opportunity to ask questions and all were answered. The patient agreed with the plan and demonstrated an understanding of the instructions.  A copy of instructions were sent to the patient via MyChart unless otherwise noted below.   The patient was advised to call back or seek an in-person evaluation if the symptoms worsen or if the condition fails to improve as anticipated.  Jarold Motto, [place]

## 2023-04-05 ENCOUNTER — Ambulatory Visit: Payer: 59 | Admitting: Physician Assistant

## 2023-04-10 ENCOUNTER — Ambulatory Visit (HOSPITAL_BASED_OUTPATIENT_CLINIC_OR_DEPARTMENT_OTHER): Payer: 59

## 2023-04-10 ENCOUNTER — Ambulatory Visit (INDEPENDENT_AMBULATORY_CARE_PROVIDER_SITE_OTHER): Payer: 59 | Admitting: Student

## 2023-04-10 ENCOUNTER — Encounter: Payer: Self-pay | Admitting: Physician Assistant

## 2023-04-10 ENCOUNTER — Encounter (HOSPITAL_BASED_OUTPATIENT_CLINIC_OR_DEPARTMENT_OTHER): Payer: Self-pay | Admitting: Student

## 2023-04-10 ENCOUNTER — Other Ambulatory Visit (HOSPITAL_BASED_OUTPATIENT_CLINIC_OR_DEPARTMENT_OTHER): Payer: Self-pay

## 2023-04-10 ENCOUNTER — Other Ambulatory Visit: Payer: Self-pay | Admitting: Physician Assistant

## 2023-04-10 DIAGNOSIS — M5412 Radiculopathy, cervical region: Secondary | ICD-10-CM

## 2023-04-10 DIAGNOSIS — M25512 Pain in left shoulder: Secondary | ICD-10-CM

## 2023-04-10 DIAGNOSIS — M47812 Spondylosis without myelopathy or radiculopathy, cervical region: Secondary | ICD-10-CM | POA: Diagnosis not present

## 2023-04-10 DIAGNOSIS — M542 Cervicalgia: Secondary | ICD-10-CM

## 2023-04-10 DIAGNOSIS — M4802 Spinal stenosis, cervical region: Secondary | ICD-10-CM | POA: Diagnosis not present

## 2023-04-10 DIAGNOSIS — M5030 Other cervical disc degeneration, unspecified cervical region: Secondary | ICD-10-CM | POA: Diagnosis not present

## 2023-04-10 MED ORDER — METHOCARBAMOL 500 MG PO TABS
500.0000 mg | ORAL_TABLET | Freq: Four times a day (QID) | ORAL | 0 refills | Status: AC
  Filled 2023-04-10: qty 40, 10d supply, fill #0

## 2023-04-10 MED ORDER — MELOXICAM 15 MG PO TABS
15.0000 mg | ORAL_TABLET | Freq: Every day | ORAL | 0 refills | Status: DC
  Filled 2023-04-10: qty 14, 14d supply, fill #0

## 2023-04-10 NOTE — Progress Notes (Unsigned)
 Chief Complaint: Neck pain     History of Present Illness:   Discussed the use of AI scribe software for clinical note transcription with the patient, who gave verbal consent to proceed.   Andrea Gray is a 48 year old female who presents with neck pain and left-sided radicular symptoms. She has been experiencing neck pain for a couple of weeks, which began after assisting her father who had fallen. The pain is constant and severe, originating in the neck and radiating down the left side, affecting her scapular area, the back of her arm, and occasionally her chest. 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 There is no previous history of similar neck issues.  The pain significantly impacts her ability to work and sleep, as she cannot find a comfortable position at night. She is right-handed. She has taken a five-day course of prednisone, which provided some relief, but the pain returned after stopping the medication. She also takes 800 mg of ibuprofen and alternates with Tylenol for pain management. She has not tried muscle relaxers due to concerns about drowsiness affecting her work. No pain symptoms on the right side.     Surgical History:   None  PMH/PSH/Family History/Social History/Meds/Allergies:    Past Medical History:  Diagnosis Date   Allergy    Anxiety    Family history of adverse reaction to anesthesia    Father has PONV   Fibroadenoma of right breast    GERD (gastroesophageal reflux disease)    Headache    Kidney stones 2007   PVC's (premature ventricular contractions)    Hx: of   Umbilical hernia    Past Surgical History:  Procedure Laterality Date   BREAST SURGERY  2017   removal of Fibroadenoma   CESAREAN SECTION     CHOLECYSTECTOMY     CYST EXCISION     from lip as a toddler   DILATION AND CURETTAGE OF UTERUS     EYE  SURGERY     LASIK     LITHOTRIPSY     MASS EXCISION Right 10/28/2015   Procedure: EXCISION OF RIGHT BREAST FIBROADENOMA;  Surgeon: Avel Peace, MD;  Location: [facility] OR;  Service: General;  Laterality: Right;   WISDOM TOOTH EXTRACTION     Social History   Socioeconomic History   Marital status: Married    Spouse name: Not on file   Number of children: Not on file   Years of education: Not on file   Highest education level: Associate degree: occupational, Scientist, product/process development, or vocational program  Occupational History   Not on file  Tobacco Use   Smoking status: Never   Smokeless tobacco: Never  Vaping Use   Vaping status: Never Used  Substance and Sexual Activity   Alcohol use: Yes    Alcohol/week: 5.0 standard drinks of alcohol    Types: 5 Glasses of wine per week    Comment: daily wine 1-2 glasses   Drug use: No   Sexual activity: Yes    Birth control/protection: Pill  Other Topics Concern   Not on file  Social History Narrative   She is a massage therapist   Husband is pharmacist   Has twin boys   Social Drivers of Health  Financial Resource Strain: Low Risk  (12/07/2022)   Overall Financial Resource Strain (CARDIA)    Difficulty of Paying Living Expenses: Not hard at all  Food Insecurity: No Food Insecurity (12/07/2022)   Hunger Vital Sign    Worried About Running Out of Food in the Last Year: Never true    Ran Out of Food in the Last Year: Never true  Transportation Needs: No Transportation Needs (12/07/2022)   PRAPARE - Administrator, Civil Service (Medical): No    Lack of Transportation (Non-Medical): No  Physical Activity: Sufficiently Active (12/07/2022)   Exercise Vital Sign    Days of Exercise per Week: 5 days    Minutes of Exercise per Session: 30 min  Stress: No Stress Concern Present (12/07/2022)   [employer] of Occupational Health - Occupational Stress Questionnaire    Feeling of Stress : Only a little  Social Connections: Socially  Integrated (12/07/2022)   Social Connection and Isolation Panel [NHANES]    Frequency of Communication with Friends and Family: More than three times a week    Frequency of Social Gatherings with Friends and Family: More than three times a week    Attends Religious Services: More than 4 times per year    Active Member of [employer] or Organizations: Yes    Attends Engineer, structural: More than 4 times per year    Marital Status: Married   Family History  Problem Relation Age of Onset   Hypertension Mother    COPD Mother    Depression Mother    Diabetes Mother    Hypertension Father    Alcohol abuse Brother    COPD Brother    Depression Brother    Drug abuse Brother    Hypertension Brother    Learning disabilities Brother    Mental illness Brother    Arthritis Maternal Grandmother    Asthma Maternal Grandmother    Hypertension Maternal Grandmother    Alcohol abuse Maternal Grandfather    Hypertension Maternal Grandfather    Arthritis Paternal Grandmother    Cancer Paternal Grandmother        unknown type   Early death Paternal Grandmother    Hearing loss Paternal Grandfather    Breast cancer Other    Allergies  Allergen Reactions   Penicillins     Has patient had a PCN reaction causing immediate rash, facial/tongue/throat swelling, SOB or lightheadedness with hypotension: Yes Has patient had a PCN reaction causing severe rash involving mucus membranes or skin necrosis: No Has patient had a PCN reaction that required hospitalization Yes Has patient had a PCN reaction occurring within the last 10 years: No If all of the above answers are "NO", then may proceed with Cephalosporin use.     Current Outpatient Medications  Medication Sig Dispense Refill   meloxicam (MOBIC) 15 MG tablet Take 1 tablet (15 mg total) by mouth daily for 14 days. 14 tablet 0   methocarbamol (ROBAXIN) 500 MG tablet Take 1 tablet (500 mg total) by mouth 4 (four) times daily for 10 days. 40  tablet 0   amLODipine (NORVASC) 2.5 MG tablet Take 1 tablet (2.5 mg total) by mouth daily. 30 tablet 5   B Complex Vitamins (B COMPLEX PO) Take 1 tablet by mouth daily in the afternoon.     busPIRone (BUSPAR) 5 MG tablet Take 1 tablet (5 mg total) by mouth 2 (two) times daily. Needs appointment. 60 tablet 5   clobetasol cream (TEMOVATE) 0.05 %  Apply topically to skin twice daily in the morning and evening.  Maximum use is 2 weeks (Patient taking differently: Apply 1 Application topically as needed.) 60 g 1   Collagen Hydrolysate POWD 1 Scoop by Does not apply route daily in the afternoon.     cyclobenzaprine (FLEXERIL) 5 MG tablet Take 1-2 tablets (5-10 mg total) by mouth 3 (three) times daily as needed for muscle spasms. 30 tablet 1   fluticasone (FLONASE) 50 MCG/ACT nasal spray Place 2 sprays into both nostrils daily. 16 g 6   ibuprofen (ADVIL,MOTRIN) 200 MG tablet Take 400 mg by mouth every 6 (six) hours as needed for mild pain.     Multiple Vitamin (MULTIVITAMIN) tablet Take 1 tablet by mouth daily. 90 tablet 3   norethindrone (INCASSIA) 0.35 MG tablet Take 1 tablet (0.35 mg total) by mouth daily. 84 tablet 4   pantoprazole (PROTONIX) 40 MG tablet Take 1 tablet (40 mg total) by mouth daily. 95 tablet 4   predniSONE (DELTASONE) 20 MG tablet Take 2 tablets (40 mg total) by mouth daily with breakfast. 10 tablet 0   triamcinolone cream (KENALOG) 0.1 % Apply topically 2 (two) times daily. 454 g 1   No current facility-administered medications for this visit.   No results found.  Review of Systems:   A ROS was performed including pertinent positives and negatives as documented in the HPI.  Physical Exam :   Constitutional: NAD and appears stated age Neurological: Alert and oriented Psych: Appropriate affect and cooperative There were no vitals taken for this visit.   Comprehensive Musculoskeletal Exam:    No tenderness palpation of the cervical spine or paraspinal muscles.  Pain and  mildly decreased ROM with cervical flexion, extension, and bilateral sidebending.  Full ROM and no pain with bilateral cervical rotation.  Negative Spurling's maneuver.  Grip strength 5/5 bilaterally.  Full active shoulder ROM bilaterally to 160 degrees forward flexion, 90 degrees external rotation, and internal rotation to L1.  Imaging:   Xray (cervical spine 4 views): Straightening of the cervical vertebrae without evidence of acute abnormality.  Mild disc space loss and small anterior osteophytes noted between C6-C7.   I personally reviewed and interpreted the radiographs.   Assessment and Plan:    Cervical Radiculopathy   Left-sided neck pain radiates to the scapula and left arm, with occasional numbness and tingling in the left hand. Pain worsens with neck extension and lateral flexion to the left. X-ray reveals slight straightening of the cervical spine without significant disc space narrowing or alignment issues. Start physical therapy, with a follow-up after 3-4 sessions. Prescribe Robaxin (Methocarbamol) for evening muscle relaxation. Prescribe Meloxicam 15 mg for two weeks as a stronger anti-inflammatory, to be taken once daily in the morning. Schedule a follow-up appointment in approximately five weeks to assess progress.       I personally saw and evaluated the patient, and participated in the management and treatment plan.  Hazle Nordmann, PA-C Orthopedics

## 2023-04-13 NOTE — Therapy (Signed)
 OUTPATIENT PHYSICAL THERAPY EVALUATION   Patient Name: Andrea Gray MRN: [id number] DOB:[birth year], 48 y.o., female Today's Date: 04/19/2023  END OF SESSION:  PT End of Session - 04/19/23 0801     Visit Number 1    Number of Visits 20    Date for PT Re-Evaluation 06/28/23    Authorization Type [facility] [employer] $25 copay    Progress Note Due on Visit 10    PT Start Time 0804    PT Stop Time 0839    PT Time Calculation (min) 35 min    Activity Tolerance Patient tolerated treatment well    Behavior During Therapy [facility] for tasks assessed/performed             Past Medical History:  Diagnosis Date   Allergy    Anxiety    Family history of adverse reaction to anesthesia    Father has PONV   Fibroadenoma of right breast    GERD (gastroesophageal reflux disease)    Headache    Kidney stones 2007   PVC's (premature ventricular contractions)    Hx: of   Umbilical hernia    Past Surgical History:  Procedure Laterality Date   BREAST SURGERY  2017   removal of Fibroadenoma   CESAREAN SECTION     CHOLECYSTECTOMY     CYST EXCISION     from lip as a toddler   DILATION AND CURETTAGE OF UTERUS     EYE SURGERY     LASIK     LITHOTRIPSY     MASS EXCISION Right 10/28/2015   Procedure: EXCISION OF RIGHT BREAST FIBROADENOMA;  Surgeon: Avel Peace, MD;  Location: [facility] OR;  Service: General;  Laterality: Right;   WISDOM TOOTH EXTRACTION     Patient Active Problem List   Diagnosis Date Noted   Menopausal syndrome 11/02/2022   Anxiety 06/09/2019   Hypertension 11/27/2016    PCP: Jarold Motto PA  REFERRING PROVIDER: Amador Cunas, PA-C  REFERRING DIAG: M54.2 (ICD-10-CM) - Neck pain  THERAPY DIAG:  Cervicalgia  Abnormal posture  Pain in left arm  Muscle weakness (generalized)  Rationale for Evaluation and Treatment: Rehabilitation  ONSET DATE: Mar 31, 2023  SUBJECTIVE:                                                                                                                                                                                                          SUBJECTIVE STATEMENT: Pt. Indicated onset after lifting dad off floor. Pt indicated having complaints on Lt side of neck into Lt shoulder/upper  arm and into lat.  Pt indicated difficulty c sleeping, standing, sitting, walking.  Pt indicated some numbness into Lt hand that increases with pain.  Initially couldn't sleep but not sleeping.  Denied headaches, visual changes.   Had prednisone pack with help. Pt indicated massage, heat, cupping, baths with minimal improvements.   Denied previous trouble of symptoms like this.   PERTINENT HISTORY:  Medical history: Anxiety, GERD, HA,   PAIN:  NPRS scale: current 8//10, at worst 10/10, at best 0/10 Pain location: Lt cervical, shoulder/ upper trap, back of shoulder, lat Pain description: constant, sharp at times, clinching Aggravating factors: standing Relieving factors: Prednisone  PRECAUTIONS: None  RED FLAGS: None  WEIGHT BEARING RESTRICTIONS: No  FALLS:  Has patient fallen in last 6 months? No  LIVING ENVIRONMENT: Unremarkable for condition  OCCUPATION: Massage therapy (hasn't missed work)   PLOF: Independent, Rt handed, reading.  Walking for exercise.   PATIENT GOALS: Reduce pain    OBJECTIVE:   DIAGNOSTIC FINDINGS:  Xray (cervical spine 4 views): Straightening of the cervical vertebrae without evidence of acute abnormality.  Mild disc space loss and small anterior osteophytes noted between C6-C7.  PATIENT SURVEYS: Patient-Specific Activity Scoring Scheme  "0" represents "unable to perform." "10" represents "able to perform at prior level. 0 1 2 3 4 5 6 7 8 9  10 (Date and Score)   Activity 04/19/2023    1. Work  5/10    2. Sleep  5/10    3. Standing 5/10   4. Sitting 5/10   5.    Score 5/10 avg    Total score = sum of the activity scores/number of activities Minimum detectable change (90%CI) for average  score = 2 points Minimum detectable change (90%CI) for single activity score = 3 points    COGNITION: 04/19/2023 Overall cognitive status: Within functional limits for tasks assessed  SENSATION: 04/19/2023 WFL  POSTURE:  04/19/2023 rounded shoulders  PALPATION: 04/19/2023 Trigger points c concordant symptoms noted in Lt upper trap, Lt infraspinatus.  Supine cervical downslope mobility assessment WFL s symptoms bilaterally today   CERVICAL ROM:   ROM AROM (deg) 04/19/2023  Flexion 49 c neck pain  Extension 56 c neck pain  Right lateral flexion   Left lateral flexion   Right rotation 75  Left rotation 75   (Blank rows = not tested)  UPPER EXTREMITY ROM:   ROM Right 04/19/2023 Left 04/19/2023  Shoulder flexion [facility]  Shoulder extension    Shoulder abduction    Shoulder adduction    Shoulder extension    Shoulder internal rotation    Shoulder external rotation    Elbow flexion    Elbow extension    Wrist flexion    Wrist extension    Wrist ulnar deviation    Wrist radial deviation    Wrist pronation    Wrist supination     (Blank rows = not tested)  UPPER EXTREMITY MMT:  MMT Right 04/19/2023 Left 04/19/2023  Shoulder flexion 5/5 5/5  Shoulder extension  3+/5 c pain  Shoulder abduction 5/5 4+/5 c neck pain  Shoulder adduction    Shoulder extension    Shoulder internal rotation 5/5 5/5  Shoulder external rotation 5/5 4+/5  Middle trapezius    Lower trapezius    Elbow flexion    Elbow extension    Wrist flexion    Wrist extension    Wrist ulnar deviation    Wrist radial deviation    Wrist pronation  Wrist supination    Grip strength     (Blank rows = not tested)  SPECIAL TESTS:  04/19/2023 (-) Distraction  cervical   FUNCTIONAL TESTS:  04/19/2023 None performed                                                                                                                                                                                  TODAY'S  TREATMENT:                                                                                                       DATE: 04/19/2023 Therex:    HEP instruction/performance c cues for techniques, handout provided.  Trial set performed of each for comprehension and symptom assessment.  See below for exercise list. Education performed with moist heat on Lt shoulder/upper trap   Manual Compression to Lt upper trap, Lt infraspinatus.  Self Care Education verbally on post manual and needling soreness possibility and effective strategy to help address and improve following visit.  Strategies included but not limited to:  heat/ice prn, increased water intake, use of HEP and general mobility to move muscle soreness out.  Pt voiced understanding.  Performed with moist heat of Lt shoulder.    Trigger Point Dry Needling Initial Treatment: Pt instructed on Dry Needling rational, procedures, and possible side effects. Pt instructed to expect mild to moderate muscle soreness later in the day and/or into the next day.  Pt instructed in methods to reduce muscle soreness. Pt instructed to continue prescribed HEP. Patient verbalized understanding of these instructions and education.  Patient Verbal Consent Given: Yes Education Handout Provided: Yes Muscles treated: Lt upper trap in prone Treatment response/outcome: local twitch response    PATIENT EDUCATION:  04/19/2023 Education details: HEP, POC, DN Person educated: Patient Education method: Explanation, Demonstration, Verbal cues, and Handouts Education comprehension: verbalized understanding, returned demonstration, and verbal cues required  HOME EXERCISE PROGRAM: Access Code: [email or web address] URL: [email or web address] Date: 04/19/2023 Prepared by: Chyrel Masson  Exercises - Seated Scapular Retraction  - 3-5 x daily - 7 x weekly - 1 sets - 10 reps - 3-5 hold - Seated Upper Trapezius Stretch (Mirrored)  - 2-3 x daily - 7 x weekly - 1 sets -  3-5 reps -  15 hold - Standing Shoulder Posterior Capsule Stretch  - 2-3 x daily - 7 x weekly - 1 sets - 5 reps - 15-30 hold - Prone Scapular Slide with Shoulder Extension  - 1 x daily - 7 x weekly - 1 sets - 10 reps - 5 hold - Prone Scapular Retraction Arms at Side  - 1 x daily - 7 x weekly - 1 sets - 10 reps - 5 hold - Prone Scapular Retraction Y  - 1 x daily - 7 x weekly - 1 sets - 10 reps - 3-5 hold  Patient Education - Trigger Point Dry Needling  ASSESSMENT:  CLINICAL IMPRESSION: Patient is a 48 y.o. who comes to clinic with complaints of Lt cervical, Lt shoulder/arm with mobility, strength and movement coordination deficits that impair their ability to perform usual daily and recreational functional activities without increase difficulty/symptoms at this time.  Patient to benefit from skilled PT services to address impairments and limitations to improve to previous level of function without restriction secondary to condition.    OBJECTIVE IMPAIRMENTS: decreased activity tolerance, decreased coordination, decreased endurance, decreased mobility, decreased ROM, decreased strength, increased fascial restrictions, impaired perceived functional ability, increased muscle spasms, impaired flexibility, impaired UE functional use, improper body mechanics, postural dysfunction, and pain.   ACTIVITY LIMITATIONS: carrying, lifting, bending, sitting, standing, sleeping, and reach over head  PARTICIPATION LIMITATIONS: meal prep, cleaning, laundry, interpersonal relationship, driving, shopping, community activity, and occupation  PERSONAL FACTORS:  Anxiety, GERD, PVC history medical history may   affect patient's functional outcome.   REHAB POTENTIAL: Good  CLINICAL DECISION MAKING: Stable/uncomplicated  EVALUATION COMPLEXITY: Low   GOALS: Goals reviewed with patient? Yes  SHORT TERM GOALS: (target date for Short term goals are 3 weeks 05/10/2023 )  1.Patient will demonstrate independent use  of home exercise program to maintain progress from in clinic treatments. Goal status: New  LONG TERM GOALS: (target dates for all long term goals are 10 weeks  06/28/2023 )   1. Patient will demonstrate/report pain at worst less than or equal to 2/10 to facilitate minimal limitation in daily activity secondary to pain symptoms. Goal status: New   2. Patient will demonstrate independent use of home exercise program to facilitate ability to maintain/progress functional gains from skilled physical therapy services. Goal status: New   3. Patient will demonstrate Patient specific functional scale avg > or = 8/10 to indicate reduced disability due to condition.  Goal status: New   4.  Patient will demonstrate cervical AROM WFL s symptoms to facilitate usual head movements for daily activity including driving, self care.   Goal status: New   5.  Patient will demonstrate Lt shoulder MMT 5/5 s symptoms.   Goal status: New     PLAN:  PT FREQUENCY: 1-2x/week  PT DURATION: 10 weeks  Can include 16109- PT Re-evaluation, 97110-Therapeutic exercises, 97530- Therapeutic activity, 97112- Neuromuscular re-education, 97535- Self Care, 97140- Manual therapy, [phone]- Gait training, [phone]- Orthotic Fit/training, [phone]- Canalith repositioning, [id number]- Aquatic Therapy, [phone] Electrical stimulation (unattended), [phone]- Electrical stimulation (manual), [id number]- Vasopneumatic device, [id number]- Ultrasound, [id number] Physical performance testing, [id number]- Traction (mechanical), [id number]- Ionotophoresis 4mg /ml Dexamethasone, Patient/Family education, Balance training, Stair training, Taping, Dry Needling, Joint mobilization, Joint manipulation, Spinal manipulation, Spinal mobilization, Scar mobilization, Vestibular training, Visual/preceptual remediation/compensation, DME instructions, Cryotherapy, and Moist heat.  All performed as medically necessary.  All included unless contraindicated  PLAN FOR NEXT SESSION: Check HEP  use/response.  Dry needling option for Lt infraspinatus, Lt upper trap.  Chyrel Masson, PT, DPT, OCS, ATC 04/19/23  8:53 AM

## 2023-04-16 ENCOUNTER — Other Ambulatory Visit (HOSPITAL_BASED_OUTPATIENT_CLINIC_OR_DEPARTMENT_OTHER): Payer: Self-pay

## 2023-04-16 ENCOUNTER — Other Ambulatory Visit (HOSPITAL_BASED_OUTPATIENT_CLINIC_OR_DEPARTMENT_OTHER): Payer: Self-pay | Admitting: Student

## 2023-04-16 ENCOUNTER — Encounter (HOSPITAL_BASED_OUTPATIENT_CLINIC_OR_DEPARTMENT_OTHER): Payer: Self-pay

## 2023-04-16 MED ORDER — METHYLPREDNISOLONE 4 MG PO TBPK
ORAL_TABLET | ORAL | 0 refills | Status: DC
  Filled 2023-04-16: qty 21, 6d supply, fill #0

## 2023-04-18 ENCOUNTER — Other Ambulatory Visit: Payer: Self-pay

## 2023-04-18 ENCOUNTER — Other Ambulatory Visit (HOSPITAL_BASED_OUTPATIENT_CLINIC_OR_DEPARTMENT_OTHER): Payer: Self-pay

## 2023-04-19 ENCOUNTER — Encounter: Payer: Self-pay | Admitting: Rehabilitative and Restorative Service Providers"

## 2023-04-19 ENCOUNTER — Other Ambulatory Visit: Payer: Self-pay

## 2023-04-19 ENCOUNTER — Ambulatory Visit: Payer: 59 | Admitting: Rehabilitative and Restorative Service Providers"

## 2023-04-19 DIAGNOSIS — M79602 Pain in left arm: Secondary | ICD-10-CM | POA: Diagnosis not present

## 2023-04-19 DIAGNOSIS — M6281 Muscle weakness (generalized): Secondary | ICD-10-CM

## 2023-04-19 DIAGNOSIS — M542 Cervicalgia: Secondary | ICD-10-CM

## 2023-04-19 DIAGNOSIS — R293 Abnormal posture: Secondary | ICD-10-CM

## 2023-04-24 ENCOUNTER — Other Ambulatory Visit (HOSPITAL_BASED_OUTPATIENT_CLINIC_OR_DEPARTMENT_OTHER): Payer: Self-pay

## 2023-04-24 ENCOUNTER — Other Ambulatory Visit (HOSPITAL_BASED_OUTPATIENT_CLINIC_OR_DEPARTMENT_OTHER): Payer: Self-pay | Admitting: Student

## 2023-04-24 MED ORDER — MELOXICAM 15 MG PO TABS
15.0000 mg | ORAL_TABLET | Freq: Every day | ORAL | 0 refills | Status: AC
Start: 2023-04-24 — End: 2023-05-10
  Filled 2023-04-24: qty 14, 14d supply, fill #0

## 2023-04-25 ENCOUNTER — Ambulatory Visit: Admitting: Rehabilitative and Restorative Service Providers"

## 2023-04-25 ENCOUNTER — Encounter: Payer: Self-pay | Admitting: Rehabilitative and Restorative Service Providers"

## 2023-04-25 DIAGNOSIS — M542 Cervicalgia: Secondary | ICD-10-CM

## 2023-04-25 DIAGNOSIS — M6281 Muscle weakness (generalized): Secondary | ICD-10-CM | POA: Diagnosis not present

## 2023-04-25 DIAGNOSIS — M79602 Pain in left arm: Secondary | ICD-10-CM | POA: Diagnosis not present

## 2023-04-25 DIAGNOSIS — R293 Abnormal posture: Secondary | ICD-10-CM | POA: Diagnosis not present

## 2023-04-25 NOTE — Therapy (Signed)
 OUTPATIENT PHYSICAL THERAPY TREATMENT   Patient Name: Andrea Gray MRN: [id number] DOB:01/18/1976, 48 y.o., female Today's Date: 04/25/2023  END OF SESSION:  PT End of Session - 04/25/23 1513     Visit Number 2    Number of Visits 20    Date for PT Re-Evaluation 06/28/23    Authorization Type [facility] [employer] $25 copay    Progress Note Due on Visit 10    PT Start Time 1512    PT Stop Time 1553    PT Time Calculation (min) 41 min    Activity Tolerance Patient tolerated treatment well    Behavior During Therapy WFL for tasks assessed/performed              Past Medical History:  Diagnosis Date   Allergy    Anxiety    Family history of adverse reaction to anesthesia    Father has PONV   Fibroadenoma of right breast    GERD (gastroesophageal reflux disease)    Headache    Kidney stones 2007   PVC's (premature ventricular contractions)    Hx: of   Umbilical hernia    Past Surgical History:  Procedure Laterality Date   BREAST SURGERY  2017   removal of Fibroadenoma   CESAREAN SECTION     CHOLECYSTECTOMY     CYST EXCISION     from lip as a toddler   DILATION AND CURETTAGE OF UTERUS     EYE SURGERY     LASIK     LITHOTRIPSY     MASS EXCISION Right 10/28/2015   Procedure: EXCISION OF RIGHT BREAST FIBROADENOMA;  Surgeon: Avel Peace, MD;  Location: [facility] OR;  Service: General;  Laterality: Right;   WISDOM TOOTH EXTRACTION     Patient Active Problem List   Diagnosis Date Noted   Menopausal syndrome 11/02/2022   Anxiety 06/09/2019   Hypertension 11/27/2016    PCP: Jarold Motto PA  REFERRING PROVIDER: Amador Cunas, PA-C  REFERRING DIAG: M54.2 (ICD-10-CM) - Neck pain  THERAPY DIAG:  Cervicalgia  Abnormal posture  Pain in left arm  Muscle weakness (generalized)  Rationale for Evaluation and Treatment: Rehabilitation  ONSET DATE: Mar 31, 2023  SUBJECTIVE:                                                                                                                                                                                                          SUBJECTIVE STATEMENT: Pt indicated feeling improvement with no trouble with soreness.   PERTINENT HISTORY:  Medical history: Anxiety, GERD, HA,  PAIN:  NPRS scale: at worst in last 24 hours:  5-6/10, at best 0/10 Pain location: Lt cervical, shoulder/ upper trap, back of shoulder, lat Pain description: constant, sharp at times, clinching Aggravating factors: standing Relieving factors: Prednisone  PRECAUTIONS: None  RED FLAGS: None  WEIGHT BEARING RESTRICTIONS: No  FALLS:  Has patient fallen in last 6 months? No  LIVING ENVIRONMENT: Unremarkable for condition  OCCUPATION: Massage therapy (hasn't missed work)   PLOF: Independent, Rt handed, reading.  Walking for exercise.   PATIENT GOALS: Reduce pain    OBJECTIVE:   DIAGNOSTIC FINDINGS:  Xray (cervical spine 4 views): Straightening of the cervical vertebrae without evidence of acute abnormality.  Mild disc space loss and small anterior osteophytes noted between C6-C7.  PATIENT SURVEYS: Patient-Specific Activity Scoring Scheme  "0" represents "unable to perform." "10" represents "able to perform at prior level. 0 1 2 3 4 5 6 7 8 9  10 (Date and Score)   Activity 04/19/2023    1. Work  5/10    2. Sleep  5/10    3. Standing 5/10   4. Sitting 5/10   5.    Score 5/10 avg    Total score = sum of the activity scores/number of activities Minimum detectable change (90%CI) for average score = 2 points Minimum detectable change (90%CI) for single activity score = 3 points    COGNITION: 04/19/2023 Overall cognitive status: Within functional limits for tasks assessed  SENSATION: 04/19/2023 WFL  POSTURE:  04/19/2023 rounded shoulders  PALPATION: 04/25/2023:  Trigger points still noted in Lt upper trap and lat near scapula.   04/19/2023 Trigger points c concordant symptoms noted in Lt upper trap, Lt  infraspinatus.  Supine cervical downslope mobility assessment WFL s symptoms bilaterally today   CERVICAL ROM:   ROM AROM (deg) 04/19/2023   Flexion 49 c neck pain   Extension 56 c neck pain   Right lateral flexion    Left lateral flexion    Right rotation 75   Left rotation 75    (Blank rows = not tested)  UPPER EXTREMITY ROM:   ROM Right 04/19/2023 Left 04/19/2023  Shoulder flexion [facility]  Shoulder extension    Shoulder abduction    Shoulder adduction    Shoulder extension    Shoulder internal rotation    Shoulder external rotation    Elbow flexion    Elbow extension    Wrist flexion    Wrist extension    Wrist ulnar deviation    Wrist radial deviation    Wrist pronation    Wrist supination     (Blank rows = not tested)  UPPER EXTREMITY MMT:  MMT Right 04/19/2023 Left 04/19/2023  Shoulder flexion 5/5 5/5  Shoulder extension  3+/5 c pain  Shoulder abduction 5/5 4+/5 c neck pain  Shoulder adduction    Shoulder extension    Shoulder internal rotation 5/5 5/5  Shoulder external rotation 5/5 4+/5  Middle trapezius    Lower trapezius    Elbow flexion    Elbow extension    Wrist flexion    Wrist extension    Wrist ulnar deviation    Wrist radial deviation    Wrist pronation    Wrist supination    Grip strength     (Blank rows = not tested)  SPECIAL TESTS:  04/19/2023 (-) Distraction  cervical   FUNCTIONAL TESTS:  04/19/2023 None performed  TODAY'S TREATMENT:                                                                                                       DATE: 04/25/2023 Therex: Review of existing HEP c cues. UBE fwd/rev 4 mins each with 1 min rest break lvl 3.0 Standing blue band rows c scapular retraction 2 x 15 Standing blue band GH ext 2 x 15 Standing blue band bilateral  shoulder ER c scap retraction x 15 Seated lat pull down blue band 2 x 15    Manual Compression to Lt upper trap, Lt infraspinatus. Percussive device to Lt upper trap, Lt infraspinatus, Lt lat.    Trigger Point Dry Needling Initial Treatment: Pt instructed on Dry Needling rational, procedures, and possible side effects. Pt instructed to expect mild to moderate muscle soreness later in the day and/or into the next day.  Pt instructed in methods to reduce muscle soreness. Pt instructed to continue prescribed HEP. Patient verbalized understanding of these instructions and education.  Patient Verbal Consent Given: Yes Education Handout Provided: Yes Muscles treated: Lt upper trap in prone, Lt lat near scapula Treatment response/outcome: local twitch response   TODAY'S TREATMENT:                                                                                                       DATE: 04/19/2023 Therex:    HEP instruction/performance c cues for techniques, handout provided.  Trial set performed of each for comprehension and symptom assessment.  See below for exercise list. Education performed with moist heat on Lt shoulder/upper trap   Manual Compression to Lt upper trap, Lt infraspinatus.  Self Care Education verbally on post manual and needling soreness possibility and effective strategy to help address and improve following visit.  Strategies included but not limited to:  heat/ice prn, increased water intake, use of HEP and general mobility to move muscle soreness out.  Pt voiced understanding.  Performed with moist heat of Lt shoulder.    Trigger Point Dry Needling Initial Treatment: Pt instructed on Dry Needling rational, procedures, and possible side effects. Pt instructed to expect mild to moderate muscle soreness later in the day and/or into the next day.  Pt instructed in methods to reduce muscle soreness. Pt instructed to continue prescribed HEP. Patient verbalized  understanding of these instructions and education.  Patient Verbal Consent Given: Yes Education Handout Provided: Yes Muscles treated: Lt upper trap in prone Treatment response/outcome: local twitch response    PATIENT EDUCATION:  04/19/2023 Education details: HEP, POC, DN Person educated: Patient Education method: Explanation, Demonstration, Verbal cues, and Handouts Education comprehension: verbalized understanding, returned demonstration, and  verbal cues required  HOME EXERCISE PROGRAM: Access Code: [email or web address] URL: [email or web address] Date: 04/19/2023 Prepared by: Chyrel Masson  Exercises - Seated Scapular Retraction  - 3-5 x daily - 7 x weekly - 1 sets - 10 reps - 3-5 hold - Seated Upper Trapezius Stretch (Mirrored)  - 2-3 x daily - 7 x weekly - 1 sets - 3-5 reps - 15 hold - Standing Shoulder Posterior Capsule Stretch  - 2-3 x daily - 7 x weekly - 1 sets - 5 reps - 15-30 hold - Prone Scapular Slide with Shoulder Extension  - 1 x daily - 7 x weekly - 1 sets - 10 reps - 5 hold - Prone Scapular Retraction Arms at Side  - 1 x daily - 7 x weekly - 1 sets - 10 reps - 5 hold - Prone Scapular Retraction Y  - 1 x daily - 7 x weekly - 1 sets - 10 reps - 3-5 hold  Patient Education - Trigger Point Dry Needling  ASSESSMENT:  CLINICAL IMPRESSION: Initial response from first visit positive for reduced symptoms.  Continued manual, dry needling to address complaints.  Reviewed and progressed to continue to improve muscle activation/endurance.  Continued skilled PT services indicated at this time.   OBJECTIVE IMPAIRMENTS: decreased activity tolerance, decreased coordination, decreased endurance, decreased mobility, decreased ROM, decreased strength, increased fascial restrictions, impaired perceived functional ability, increased muscle spasms, impaired flexibility, impaired UE functional use, improper body mechanics, postural dysfunction, and pain.   ACTIVITY LIMITATIONS:  carrying, lifting, bending, sitting, standing, sleeping, and reach over head  PARTICIPATION LIMITATIONS: meal prep, cleaning, laundry, interpersonal relationship, driving, shopping, community activity, and occupation  PERSONAL FACTORS:  Anxiety, GERD, PVC history medical history may   affect patient's functional outcome.   REHAB POTENTIAL: Good  CLINICAL DECISION MAKING: Stable/uncomplicated  EVALUATION COMPLEXITY: Low   GOALS: Goals reviewed with patient? Yes  SHORT TERM GOALS: (target date for Short term goals are 3 weeks 05/10/2023 )  1.Patient will demonstrate independent use of home exercise program to maintain progress from in clinic treatments. Goal status: New  LONG TERM GOALS: (target dates for all long term goals are 10 weeks  06/28/2023 )   1. Patient will demonstrate/report pain at worst less than or equal to 2/10 to facilitate minimal limitation in daily activity secondary to pain symptoms. Goal status: New   2. Patient will demonstrate independent use of home exercise program to facilitate ability to maintain/progress functional gains from skilled physical therapy services. Goal status: New   3. Patient will demonstrate Patient specific functional scale avg > or = 8/10 to indicate reduced disability due to condition.  Goal status: New   4.  Patient will demonstrate cervical AROM WFL s symptoms to facilitate usual head movements for daily activity including driving, self care.   Goal status: New   5.  Patient will demonstrate Lt shoulder MMT 5/5 s symptoms.   Goal status: New     PLAN:  PT FREQUENCY: 1-2x/week  PT DURATION: 10 weeks  Can include 54098- PT Re-evaluation, 97110-Therapeutic exercises, 97530- Therapeutic activity, 97112- Neuromuscular re-education, [phone]- Self Care, 97140- Manual therapy, [phone]- Gait training, [phone]- Orthotic Fit/training, [phone]- Canalith repositioning, [id number]- Aquatic Therapy, [phone] Electrical stimulation (unattended), [phone]-  Electrical stimulation (manual), [id number]- Vasopneumatic device, [id number]- Ultrasound, [id number] Physical performance testing, [id number]- Traction (mechanical), [id number]- Ionotophoresis 4mg /ml Dexamethasone, Patient/Family education, Balance training, Stair training, Taping, Dry Needling, Joint mobilization, Joint manipulation, Spinal manipulation, Spinal mobilization, Scar mobilization, Vestibular training, Visual/preceptual remediation/compensation, DME instructions, Cryotherapy, and  Moist heat.  All performed as medically necessary.  All included unless contraindicated  PLAN FOR NEXT SESSION: DN, soft tissue activity.  Progressive strengthening.    Chyrel Masson, PT, DPT, OCS, ATC 04/25/23  3:55 PM

## 2023-05-01 ENCOUNTER — Encounter: Payer: Self-pay | Admitting: Physical Therapy

## 2023-05-01 ENCOUNTER — Ambulatory Visit: Admitting: Physical Therapy

## 2023-05-01 DIAGNOSIS — M6281 Muscle weakness (generalized): Secondary | ICD-10-CM

## 2023-05-01 DIAGNOSIS — R293 Abnormal posture: Secondary | ICD-10-CM | POA: Diagnosis not present

## 2023-05-01 DIAGNOSIS — M79602 Pain in left arm: Secondary | ICD-10-CM | POA: Diagnosis not present

## 2023-05-01 DIAGNOSIS — M542 Cervicalgia: Secondary | ICD-10-CM | POA: Diagnosis not present

## 2023-05-01 NOTE — Therapy (Signed)
 OUTPATIENT PHYSICAL THERAPY TREATMENT   Patient Name: Andrea Gray MRN: [id number] DOB:1975-07-26, 48 y.o., female Today's Date: 05/01/2023  END OF SESSION:  PT End of Session - 05/01/23 0851     Visit Number 3    Number of Visits 20    Date for PT Re-Evaluation 06/28/23    Authorization Type [facility] [employer] $25 copay    Progress Note Due on Visit 10    PT Start Time 0846    PT Stop Time 0926    PT Time Calculation (min) 40 min    Activity Tolerance Patient tolerated treatment well    Behavior During Therapy [facility] for tasks assessed/performed               Past Medical History:  Diagnosis Date   Allergy    Anxiety    Family history of adverse reaction to anesthesia    Father has PONV   Fibroadenoma of right breast    GERD (gastroesophageal reflux disease)    Headache    Kidney stones 2007   PVC's (premature ventricular contractions)    Hx: of   Umbilical hernia    Past Surgical History:  Procedure Laterality Date   BREAST SURGERY  2017   removal of Fibroadenoma   CESAREAN SECTION     CHOLECYSTECTOMY     CYST EXCISION     from lip as a toddler   DILATION AND CURETTAGE OF UTERUS     EYE SURGERY     LASIK     LITHOTRIPSY     MASS EXCISION Right 10/28/2015   Procedure: EXCISION OF RIGHT BREAST FIBROADENOMA;  Surgeon: Avel Peace, MD;  Location: [facility] OR;  Service: General;  Laterality: Right;   WISDOM TOOTH EXTRACTION     Patient Active Problem List   Diagnosis Date Noted   Menopausal syndrome 11/02/2022   Anxiety 06/09/2019   Hypertension 11/27/2016    PCP: Jarold Motto PA  REFERRING PROVIDER: Amador Cunas, PA-C  REFERRING DIAG: M54.2 (ICD-10-CM) - Neck pain  THERAPY DIAG:  Cervicalgia  Abnormal posture  Pain in left arm  Muscle weakness (generalized)  Rationale for Evaluation and Treatment: Rehabilitation  ONSET DATE: Mar 31, 2023  SUBJECTIVE:                                                                                                                                                                                                          SUBJECTIVE STATEMENT: Pt reporting constant pain since her last visit of DN. Pt reporting pain has reached 10/10 at times.  PERTINENT HISTORY:  Medical history: Anxiety, GERD, HA,   PAIN:  NPRS scale: 9/10, worse 10/10  Pain location: Lt cervical, shoulder/ upper trap, back of shoulder, lat Pain description: constant, sharp at times, clinching Aggravating factors: standing Relieving factors: Prednisone  PRECAUTIONS: None  RED FLAGS: None  WEIGHT BEARING RESTRICTIONS: No  FALLS:  Has patient fallen in last 6 months? No  LIVING ENVIRONMENT: Unremarkable for condition  OCCUPATION: Massage therapy (hasn't missed work)   PLOF: Independent, Rt handed, reading.  Walking for exercise.   PATIENT GOALS: Reduce pain    OBJECTIVE:   DIAGNOSTIC FINDINGS:  Xray (cervical spine 4 views): Straightening of the cervical vertebrae without evidence of acute abnormality.  Mild disc space loss and small anterior osteophytes noted between C6-C7.  PATIENT SURVEYS: Patient-Specific Activity Scoring Scheme  "0" represents "unable to perform." "10" represents "able to perform at prior level. 0 1 2 3 4 5 6 7 8 9  10 (Date and Score)   Activity 04/19/2023    1. Work  5/10    2. Sleep  5/10    3. Standing 5/10   4. Sitting 5/10   5.    Score 5/10 avg    Total score = sum of the activity scores/number of activities Minimum detectable change (90%CI) for average score = 2 points Minimum detectable change (90%CI) for single activity score = 3 points    COGNITION: 04/19/2023 Overall cognitive status: Within functional limits for tasks assessed  SENSATION: 04/19/2023 WFL  POSTURE:  04/19/2023 rounded shoulders  PALPATION: 04/25/2023:  Trigger points still noted in Lt upper trap and lat near scapula.   04/19/2023 Trigger points c concordant symptoms noted in Lt upper trap,  Lt infraspinatus.  Supine cervical downslope mobility assessment WFL s symptoms bilaterally today   CERVICAL ROM:   ROM AROM (deg) 04/19/2023   Flexion 49 c neck pain   Extension 56 c neck pain   Right lateral flexion    Left lateral flexion    Right rotation 75   Left rotation 75    (Blank rows = not tested)  UPPER EXTREMITY ROM:   ROM Right 04/19/2023 Left 04/19/2023  Shoulder flexion [facility]  Shoulder extension    Shoulder abduction    Shoulder adduction    Shoulder extension    Shoulder internal rotation    Shoulder external rotation    Elbow flexion    Elbow extension    Wrist flexion    Wrist extension    Wrist ulnar deviation    Wrist radial deviation    Wrist pronation    Wrist supination     (Blank rows = not tested)  UPPER EXTREMITY MMT:  MMT Right 04/19/2023 Left 04/19/2023  Shoulder flexion 5/5 5/5  Shoulder extension  3+/5 c pain  Shoulder abduction 5/5 4+/5 c neck pain  Shoulder adduction    Shoulder extension    Shoulder internal rotation 5/5 5/5  Shoulder external rotation 5/5 4+/5  Middle trapezius    Lower trapezius    Elbow flexion    Elbow extension    Wrist flexion    Wrist extension    Wrist ulnar deviation    Wrist radial deviation    Wrist pronation    Wrist supination    Grip strength     (Blank rows = not tested)  SPECIAL TESTS:  04/19/2023 (-) Distraction  cervical   FUNCTIONAL TESTS:  04/19/2023 None performed  TODAY'S TREATMENT:                                                                                                       DATE: 05/01/2023 Therex: UBE fwd/rev 4 mins each direction Standing wall postural correction with cervical retraction Standing blue band GH ext 2 x 15 Sidelying left shoulder abduction x 5  Median nerve flossing x 10  Radial nerve  flossing x 10  Manual Scapular mobilization, active trigger point release of subscapularis Cervical occipital release and supine grade 2-3 PA mobs to C3-C6 Manual cervical distraction    TODAY'S TREATMENT:                                                                                                       DATE: 04/25/2023 Therex: Review of existing HEP c cues. UBE fwd/rev 4 mins each with 1 min rest break lvl 3.0 Standing blue band rows c scapular retraction 2 x 15 Standing blue band GH ext 2 x 15 Standing blue band bilateral shoulder ER c scap retraction x 15 Seated lat pull down blue band 2 x 15    Manual Compression to Lt upper trap, Lt infraspinatus. Percussive device to Lt upper trap, Lt infraspinatus, Lt lat.    Trigger Point Dry Needling Initial Treatment: Pt instructed on Dry Needling rational, procedures, and possible side effects. Pt instructed to expect mild to moderate muscle soreness later in the day and/or into the next day.  Pt instructed in methods to reduce muscle soreness. Pt instructed to continue prescribed HEP. Patient verbalized understanding of these instructions and education.  Patient Verbal Consent Given: Yes Education Handout Provided: Yes Muscles treated: Lt upper trap in prone, Lt lat near scapula Treatment response/outcome: local twitch response   TODAY'S TREATMENT:                                                                                                       DATE: 04/19/2023 Therex:    HEP instruction/performance c cues for techniques, handout provided.  Trial set performed of each for comprehension and symptom assessment.  See below for exercise list. Education performed with moist heat on Lt shoulder/upper trap   Manual Compression to Lt upper trap, Lt  infraspinatus.  Self Care Education verbally on post manual and needling soreness possibility and effective strategy to help address and improve following visit.  Strategies included but  not limited to:  heat/ice prn, increased water intake, use of HEP and general mobility to move muscle soreness out.  Pt voiced understanding.  Performed with moist heat of Lt shoulder.    Trigger Point Dry Needling Initial Treatment: Pt instructed on Dry Needling rational, procedures, and possible side effects. Pt instructed to expect mild to moderate muscle soreness later in the day and/or into the next day.  Pt instructed in methods to reduce muscle soreness. Pt instructed to continue prescribed HEP. Patient verbalized understanding of these instructions and education.  Patient Verbal Consent Given: Yes Education Handout Provided: Yes Muscles treated: Lt upper trap in prone Treatment response/outcome: local twitch response    PATIENT EDUCATION:  04/19/2023 Education details: HEP, POC, DN Person educated: Patient Education method: Explanation, Demonstration, Verbal cues, and Handouts Education comprehension: verbalized understanding, returned demonstration, and verbal cues required  HOME EXERCISE PROGRAM: Access Code: [email or web address] URL: [email or web address] Date: 05/01/2023 Prepared by: Narda Amber  Exercises - Seated Scapular Retraction  - 3-5 x daily - 7 x weekly - 1 sets - 10 reps - 3-5 hold - Seated Upper Trapezius Stretch (Mirrored)  - 2-3 x daily - 7 x weekly - 1 sets - 3-5 reps - 15 hold - Standing Shoulder Posterior Capsule Stretch  - 2-3 x daily - 7 x weekly - 1 sets - 5 reps - 15-30 hold - Prone Scapular Slide with Shoulder Extension  - 1 x daily - 7 x weekly - 1 sets - 10 reps - 5 hold - Prone Scapular Retraction Arms at Side  - 1 x daily - 7 x weekly - 1 sets - 10 reps - 5 hold - Prone Scapular Retraction Y  - 1 x daily - 7 x weekly - 1 sets - 10 reps - 3-5 hold - Median Nerve Flossing - Tray  - 3 x daily - 7 x weekly - 10 reps - Radial Nerve Flossing  - 3 x daily - 7 x weekly - 10 reps - Doorway Rhomboid Stretch  - 3 x daily - 7 x weekly - 3-4 reps -  10 seconds hold  Patient Education - Trigger Point Dry Needling  ASSESSMENT:  CLINICAL IMPRESSION: Pt arriving reporting increased pain following her last DN session in her lats. Pt wishing to hold DN this visit and try alternative treatments. Pt with good tolerance to manual therapy. Pt edu in nerve flossing for her median and radial nerves. HEP updated. Continue skilled PT interventions toward goals set.   OBJECTIVE IMPAIRMENTS: decreased activity tolerance, decreased coordination, decreased endurance, decreased mobility, decreased ROM, decreased strength, increased fascial restrictions, impaired perceived functional ability, increased muscle spasms, impaired flexibility, impaired UE functional use, improper body mechanics, postural dysfunction, and pain.   ACTIVITY LIMITATIONS: carrying, lifting, bending, sitting, standing, sleeping, and reach over head  PARTICIPATION LIMITATIONS: meal prep, cleaning, laundry, interpersonal relationship, driving, shopping, community activity, and occupation  PERSONAL FACTORS:  Anxiety, GERD, PVC history medical history may   affect patient's functional outcome.   REHAB POTENTIAL: Good  CLINICAL DECISION MAKING: Stable/uncomplicated  EVALUATION COMPLEXITY: Low   GOALS: Goals reviewed with patient? Yes  SHORT TERM GOALS: (target date for Short term goals are 3 weeks 05/10/2023)  1.Patient will demonstrate independent use of home exercise program to maintain progress from in clinic treatments. Goal status: New  LONG TERM  GOALS: (target dates for all long term goals are 10 weeks  06/28/2023 )   1. Patient will demonstrate/report pain at worst less than or equal to 2/10 to facilitate minimal limitation in daily activity secondary to pain symptoms. Goal status: New   2. Patient will demonstrate independent use of home exercise program to facilitate ability to maintain/progress functional gains from skilled physical therapy services. Goal status:  New   3. Patient will demonstrate Patient specific functional scale avg > or = 8/10 to indicate reduced disability due to condition.  Goal status: New   4.  Patient will demonstrate cervical AROM WFL s symptoms to facilitate usual head movements for daily activity including driving, self care.   Goal status: New   5.  Patient will demonstrate Lt shoulder MMT 5/5 s symptoms.   Goal status: New     PLAN:  PT FREQUENCY: 1-2x/week  PT DURATION: 10 weeks  Can include 16109- PT Re-evaluation, 97110-Therapeutic exercises, 97530- Therapeutic activity, 97112- Neuromuscular re-education, 97535- Self Care, 97140- Manual therapy, [phone]- Gait training, [phone]- Orthotic Fit/training, [phone]- Canalith repositioning, [id number]- Aquatic Therapy, [phone] Electrical stimulation (unattended), [phone]- Electrical stimulation (manual), [id number]- Vasopneumatic device, [id number]- Ultrasound, [id number] Physical performance testing, [id number]- Traction (mechanical), [id number]- Ionotophoresis 4mg /ml Dexamethasone, Patient/Family education, Balance training, Stair training, Taping, Dry Needling, Joint mobilization, Joint manipulation, Spinal manipulation, Spinal mobilization, Scar mobilization, Vestibular training, Visual/preceptual remediation/compensation, DME instructions, Cryotherapy, and Moist heat.  All performed as medically necessary.  All included unless contraindicated  PLAN FOR NEXT SESSION: DN, soft tissue activity.  Progressive strengthening.  Consider contract/relax of cervical spine Consider Traction   Narda Amber, PT, MPT 05/01/23 10:13 AM   05/01/23  10:13 AM

## 2023-05-03 ENCOUNTER — Other Ambulatory Visit: Payer: Self-pay | Admitting: Physician Assistant

## 2023-05-03 ENCOUNTER — Other Ambulatory Visit (HOSPITAL_BASED_OUTPATIENT_CLINIC_OR_DEPARTMENT_OTHER): Payer: Self-pay

## 2023-05-03 ENCOUNTER — Other Ambulatory Visit: Payer: Self-pay

## 2023-05-03 MED ORDER — PREGABALIN 25 MG PO CAPS
25.0000 mg | ORAL_CAPSULE | Freq: Two times a day (BID) | ORAL | 1 refills | Status: DC
  Filled 2023-05-03: qty 30, 15d supply, fill #0

## 2023-05-08 ENCOUNTER — Ambulatory Visit: Admitting: Physical Therapy

## 2023-05-08 DIAGNOSIS — R293 Abnormal posture: Secondary | ICD-10-CM

## 2023-05-08 DIAGNOSIS — M79602 Pain in left arm: Secondary | ICD-10-CM

## 2023-05-08 DIAGNOSIS — M542 Cervicalgia: Secondary | ICD-10-CM | POA: Diagnosis not present

## 2023-05-08 DIAGNOSIS — M6281 Muscle weakness (generalized): Secondary | ICD-10-CM | POA: Diagnosis not present

## 2023-05-08 NOTE — Therapy (Signed)
 OUTPATIENT PHYSICAL THERAPY TREATMENT   Patient Name: Andrea Gray MRN: [id number] DOB:01-20-1976, 48 y.o., female Today's Date: 05/08/2023  END OF SESSION:  PT End of Session - 05/08/23 0930     Visit Number 4    Number of Visits 20    Date for PT Re-Evaluation 06/28/23    Authorization Type [facility] [employer] $25 copay    Progress Note Due on Visit 10    PT Start Time 0848    PT Stop Time 0927    PT Time Calculation (min) 39 min    Activity Tolerance Patient tolerated treatment well    Behavior During Therapy [facility] for tasks assessed/performed                Past Medical History:  Diagnosis Date   Allergy    Anxiety    Family history of adverse reaction to anesthesia    Father has PONV   Fibroadenoma of right breast    GERD (gastroesophageal reflux disease)    Headache    Kidney stones 2007   PVC's (premature ventricular contractions)    Hx: of   Umbilical hernia    Past Surgical History:  Procedure Laterality Date   BREAST SURGERY  2017   removal of Fibroadenoma   CESAREAN SECTION     CHOLECYSTECTOMY     CYST EXCISION     from lip as a toddler   DILATION AND CURETTAGE OF UTERUS     EYE SURGERY     LASIK     LITHOTRIPSY     MASS EXCISION Right 10/28/2015   Procedure: EXCISION OF RIGHT BREAST FIBROADENOMA;  Surgeon: Avel Peace, MD;  Location: [facility] OR;  Service: General;  Laterality: Right;   WISDOM TOOTH EXTRACTION     Patient Active Problem List   Diagnosis Date Noted   Menopausal syndrome 11/02/2022   Anxiety 06/09/2019   Hypertension 11/27/2016    PCP: Jarold Motto PA  REFERRING PROVIDER: Amador Cunas, PA-C  REFERRING DIAG: M54.2 (ICD-10-CM) - Neck pain  THERAPY DIAG:  Cervicalgia  Pain in left arm  Abnormal posture  Muscle weakness (generalized)  Rationale for Evaluation and Treatment: Rehabilitation  ONSET DATE: Mar 31, 2023  SUBJECTIVE:                                                                                                                                                                                                          SUBJECTIVE STATEMENT: Pt reporting 8/10 pain upon arrival. Pt feels DN helped some. Pt willing to try traction.  PERTINENT HISTORY:  Medical history: Anxiety, GERD, HA,   PAIN:  NPRS scale: 8/10, worse 10/10  Pain location: Lt cervical, shoulder/ upper trap, back of shoulder, lat Pain description: constant, sharp at times, clinching Aggravating factors: standing Relieving factors: Prednisone  PRECAUTIONS: None  RED FLAGS: None  WEIGHT BEARING RESTRICTIONS: No  FALLS:  Has patient fallen in last 6 months? No  LIVING ENVIRONMENT: Unremarkable for condition  OCCUPATION: Massage therapy (hasn't missed work)   PLOF: Independent, Rt handed, reading.  Walking for exercise.   PATIENT GOALS: Reduce pain    OBJECTIVE:   DIAGNOSTIC FINDINGS:  Xray (cervical spine 4 views): Straightening of the cervical vertebrae without evidence of acute abnormality.  Mild disc space loss and small anterior osteophytes noted between C6-C7.  PATIENT SURVEYS: Patient-Specific Activity Scoring Scheme  "0" represents "unable to perform." "10" represents "able to perform at prior level. 0 1 2 3 4 5 6 7 8 9  10 (Date and Score)   Activity 04/19/2023    1. Work  5/10    2. Sleep  5/10    3. Standing 5/10   4. Sitting 5/10   5.    Score 5/10 avg    Total score = sum of the activity scores/number of activities Minimum detectable change (90%CI) for average score = 2 points Minimum detectable change (90%CI) for single activity score = 3 points    COGNITION: 04/19/2023 Overall cognitive status: Within functional limits for tasks assessed  SENSATION: 04/19/2023 WFL  POSTURE:  04/19/2023 rounded shoulders  PALPATION: 04/25/2023:  Trigger points still noted in Lt upper trap and lat near scapula.   04/19/2023 Trigger points c concordant symptoms noted in Lt upper trap, Lt  infraspinatus.  Supine cervical downslope mobility assessment WFL s symptoms bilaterally today   CERVICAL ROM:   ROM AROM (deg) 04/19/2023   Flexion 49 c neck pain   Extension 56 c neck pain   Right lateral flexion    Left lateral flexion    Right rotation 75   Left rotation 75    (Blank rows = not tested)  UPPER EXTREMITY ROM:   ROM Right 04/19/2023 Left 04/19/2023  Shoulder flexion [facility]  Shoulder extension    Shoulder abduction    Shoulder adduction    Shoulder extension    Shoulder internal rotation    Shoulder external rotation    Elbow flexion    Elbow extension    Wrist flexion    Wrist extension    Wrist ulnar deviation    Wrist radial deviation    Wrist pronation    Wrist supination     (Blank rows = not tested)  UPPER EXTREMITY MMT:  MMT Right 04/19/2023 Left 04/19/2023  Shoulder flexion 5/5 5/5  Shoulder extension  3+/5 c pain  Shoulder abduction 5/5 4+/5 c neck pain  Shoulder adduction    Shoulder extension    Shoulder internal rotation 5/5 5/5  Shoulder external rotation 5/5 4+/5  Middle trapezius    Lower trapezius    Elbow flexion    Elbow extension    Wrist flexion    Wrist extension    Wrist ulnar deviation    Wrist radial deviation    Wrist pronation    Wrist supination    Grip strength     (Blank rows = not tested)  SPECIAL TESTS:  04/19/2023 (-) Distraction  cervical   FUNCTIONAL TESTS:  04/19/2023 None performed  TODAY'S TREATMENT:                                                                                                       DATE: 05/08/23 Manual Compression to Lt upper trap, Lt infraspinatus Trigger Point Dry Needling Initial Treatment: Pt instructed on Dry Needling rational, procedures, and possible side effects. Pt instructed to expect mild to  moderate muscle soreness later in the day and/or into the next day.  Pt instructed in methods to reduce muscle soreness. Pt instructed to continue prescribed HEP. Patient verbalized understanding of these instructions and education.  Patient Verbal Consent Given: Yes Education Handout Provided: Yes Muscles treated: Lt upper trap in prone, Left cervical paraspinals, Left infraspinatus Treatment response/outcome: local twitch response noted Mechanical Traction:  Max pull 15# to minimal pull 8# x 20 minutes (cervical protocol)      TODAY'S TREATMENT:                                                                                                       DATE: 05/01/2023 Therex: UBE fwd/rev 4 mins each direction Standing wall postural correction with cervical retraction Standing blue band GH ext 2 x 15 Sidelying left shoulder abduction x 5  Median nerve flossing x 10  Radial nerve flossing x 10  Manual Scapular mobilization, active trigger point release of subscapularis Cervical occipital release and supine grade 2-3 PA mobs to C3-C6 Manual cervical distraction    TODAY'S TREATMENT:                                                                                                       DATE: 04/25/2023 Therex: Review of existing HEP c cues. UBE fwd/rev 4 mins each with 1 min rest break lvl 3.0 Standing blue band rows c scapular retraction 2 x 15 Standing blue band GH ext 2 x 15 Standing blue band bilateral shoulder ER c scap retraction x 15 Seated lat pull down blue band 2 x 15    Manual Compression to Lt upper trap, Lt infraspinatus. Percussive device to Lt upper trap, Lt infraspinatus, Lt lat.    Trigger Point Dry Needling Initial Treatment: Pt instructed on Dry Needling rational, procedures, and possible side effects. Pt instructed  to expect mild to moderate muscle soreness later in the day and/or into the next day.  Pt instructed in methods to reduce muscle soreness. Pt  instructed to continue prescribed HEP. Patient verbalized understanding of these instructions and education.  Patient Verbal Consent Given: Yes Education Handout Provided: Yes Muscles treated: Lt upper trap in prone, Lt lat near scapula Treatment response/outcome: local twitch response       PATIENT EDUCATION:  04/19/2023 Education details: HEP, POC, DN Person educated: Patient Education method: Explanation, Demonstration, Verbal cues, and Handouts Education comprehension: verbalized understanding, returned demonstration, and verbal cues required  HOME EXERCISE PROGRAM: Access Code: [email or web address] URL: [email or web address] Date: 05/01/2023 Prepared by: Narda Amber  Exercises - Seated Scapular Retraction  - 3-5 x daily - 7 x weekly - 1 sets - 10 reps - 3-5 hold - Seated Upper Trapezius Stretch (Mirrored)  - 2-3 x daily - 7 x weekly - 1 sets - 3-5 reps - 15 hold - Standing Shoulder Posterior Capsule Stretch  - 2-3 x daily - 7 x weekly - 1 sets - 5 reps - 15-30 hold - Prone Scapular Slide with Shoulder Extension  - 1 x daily - 7 x weekly - 1 sets - 10 reps - 5 hold - Prone Scapular Retraction Arms at Side  - 1 x daily - 7 x weekly - 1 sets - 10 reps - 5 hold - Prone Scapular Retraction Y  - 1 x daily - 7 x weekly - 1 sets - 10 reps - 3-5 hold - Median Nerve Flossing - Tray  - 3 x daily - 7 x weekly - 10 reps - Radial Nerve Flossing  - 3 x daily - 7 x weekly - 10 reps - Doorway Rhomboid Stretch  - 3 x daily - 7 x weekly - 3-4 reps - 10 seconds hold  Patient Education - Trigger Point Dry Needling  ASSESSMENT:  CLINICAL IMPRESSION: Pt arriving today reporting 8/10 pain. Pt willing to try traction and pt reporting muscle relief during and after. Good response to DN today.  Continue skilled PT c treatment plan set.   OBJECTIVE IMPAIRMENTS: decreased activity tolerance, decreased coordination, decreased endurance, decreased mobility, decreased ROM, decreased strength,  increased fascial restrictions, impaired perceived functional ability, increased muscle spasms, impaired flexibility, impaired UE functional use, improper body mechanics, postural dysfunction, and pain.   ACTIVITY LIMITATIONS: carrying, lifting, bending, sitting, standing, sleeping, and reach over head  PARTICIPATION LIMITATIONS: meal prep, cleaning, laundry, interpersonal relationship, driving, shopping, community activity, and occupation  PERSONAL FACTORS:  Anxiety, GERD, PVC history medical history may   affect patient's functional outcome.   REHAB POTENTIAL: Good  CLINICAL DECISION MAKING: Stable/uncomplicated  EVALUATION COMPLEXITY: Low   GOALS: Goals reviewed with patient? Yes  SHORT TERM GOALS: (target date for Short term goals are 3 weeks 05/10/2023)  1.Patient will demonstrate independent use of home exercise program to maintain progress from in clinic treatments. Goal status: MET 05/08/23  LONG TERM GOALS: (target dates for all long term goals are 10 weeks  06/28/2023 )   1. Patient will demonstrate/report pain at worst less than or equal to 2/10 to facilitate minimal limitation in daily activity secondary to pain symptoms. Goal status:  on-going 05/08/23   2. Patient will demonstrate independent use of home exercise program to facilitate ability to maintain/progress functional gains from skilled physical therapy services. Goal status: New   3. Patient will demonstrate Patient specific functional scale avg > or = 8/10 to indicate reduced disability due  to condition.  Goal status: New   4.  Patient will demonstrate cervical AROM WFL s symptoms to facilitate usual head movements for daily activity including driving, self care.   Goal status:  on-going 05/08/23   5.  Patient will demonstrate Lt shoulder MMT 5/5 s symptoms.   Goal status: New     PLAN:  PT FREQUENCY: 1-2x/week  PT DURATION: 10 weeks  Can include 69629- PT Re-evaluation, 97110-Therapeutic exercises,  97530- Therapeutic activity, 97112- Neuromuscular re-education, 97535- Self Care, [id number]- Manual therapy, [phone]- Gait training, [phone]- Orthotic Fit/training, [phone]- Canalith repositioning, [id number]- Aquatic Therapy, [phone] Electrical stimulation (unattended), [phone]- Electrical stimulation (manual), [id number]- Vasopneumatic device, [id number]- Ultrasound, [id number] Physical performance testing, [id number]- Traction (mechanical), [id number]- Ionotophoresis 4mg /ml Dexamethasone, Patient/Family education, Balance training, Stair training, Taping, Dry Needling, Joint mobilization, Joint manipulation, Spinal manipulation, Spinal mobilization, Scar mobilization, Vestibular training, Visual/preceptual remediation/compensation, DME instructions, Cryotherapy, and Moist heat.  All performed as medically necessary.  All included unless contraindicated  PLAN FOR NEXT SESSION: soft tissue activity.  Progressive strengthening.  Consider contract/relax of cervical spine Assess response Traction and DN   Narda Amber, PT, MPT 05/08/23 10:50 AM   05/08/23  10:50 AM

## 2023-05-11 ENCOUNTER — Other Ambulatory Visit (HOSPITAL_BASED_OUTPATIENT_CLINIC_OR_DEPARTMENT_OTHER): Payer: Self-pay

## 2023-05-11 ENCOUNTER — Other Ambulatory Visit (HOSPITAL_BASED_OUTPATIENT_CLINIC_OR_DEPARTMENT_OTHER): Payer: Self-pay | Admitting: Student

## 2023-05-11 MED ORDER — MELOXICAM 15 MG PO TABS
15.0000 mg | ORAL_TABLET | Freq: Every day | ORAL | 0 refills | Status: DC
Start: 2023-05-11 — End: 2023-05-22
  Filled 2023-05-11: qty 14, 14d supply, fill #0

## 2023-05-11 MED ORDER — METHOCARBAMOL 500 MG PO TABS
500.0000 mg | ORAL_TABLET | Freq: Four times a day (QID) | ORAL | 0 refills | Status: AC
Start: 2023-05-11 — End: 2023-05-24
  Filled 2023-05-11: qty 40, 10d supply, fill #0

## 2023-05-13 ENCOUNTER — Other Ambulatory Visit: Payer: Self-pay | Admitting: Physician Assistant

## 2023-05-14 ENCOUNTER — Other Ambulatory Visit (HOSPITAL_BASED_OUTPATIENT_CLINIC_OR_DEPARTMENT_OTHER): Payer: Self-pay

## 2023-05-14 MED ORDER — FLUTICASONE PROPIONATE 50 MCG/ACT NA SUSP
2.0000 | Freq: Every day | NASAL | 6 refills | Status: DC
  Filled 2023-05-14 (×2): qty 16, 30d supply, fill #0
  Filled 2023-06-07: qty 16, 30d supply, fill #1
  Filled 2023-07-13: qty 16, 30d supply, fill #2
  Filled 2023-09-19: qty 16, 30d supply, fill #3
  Filled 2023-11-16: qty 16, 30d supply, fill #4
  Filled 2023-12-12: qty 16, 30d supply, fill #5

## 2023-05-15 ENCOUNTER — Other Ambulatory Visit: Payer: Self-pay

## 2023-05-15 ENCOUNTER — Encounter: Admitting: Rehabilitative and Restorative Service Providers"

## 2023-05-22 ENCOUNTER — Encounter: Payer: Self-pay | Admitting: Rehabilitative and Restorative Service Providers"

## 2023-05-22 ENCOUNTER — Other Ambulatory Visit (HOSPITAL_BASED_OUTPATIENT_CLINIC_OR_DEPARTMENT_OTHER): Payer: Self-pay | Admitting: Student

## 2023-05-22 ENCOUNTER — Ambulatory Visit: Admitting: Rehabilitative and Restorative Service Providers"

## 2023-05-22 ENCOUNTER — Other Ambulatory Visit (HOSPITAL_BASED_OUTPATIENT_CLINIC_OR_DEPARTMENT_OTHER): Payer: Self-pay

## 2023-05-22 DIAGNOSIS — M79602 Pain in left arm: Secondary | ICD-10-CM | POA: Diagnosis not present

## 2023-05-22 DIAGNOSIS — M6281 Muscle weakness (generalized): Secondary | ICD-10-CM | POA: Diagnosis not present

## 2023-05-22 DIAGNOSIS — R293 Abnormal posture: Secondary | ICD-10-CM

## 2023-05-22 DIAGNOSIS — M542 Cervicalgia: Secondary | ICD-10-CM

## 2023-05-22 MED ORDER — MELOXICAM 15 MG PO TABS
15.0000 mg | ORAL_TABLET | Freq: Every day | ORAL | 0 refills | Status: AC
  Filled 2023-05-22: qty 14, 14d supply, fill #0

## 2023-05-22 NOTE — Therapy (Signed)
 OUTPATIENT PHYSICAL THERAPY TREATMENT   Patient Name: Andrea Gray MRN: [id number] DOB:[birth year], 48 y.o., female Today's Date: 05/22/2023  END OF SESSION:  PT End of Session - 05/22/23 0913     Visit Number 5    Number of Visits 20    Date for PT Re-Evaluation 06/28/23    Authorization Type [facility] [employer] $25 copay    Progress Note Due on Visit 10    PT Start Time 0851    PT Stop Time 0920    PT Time Calculation (min) 29 min    Activity Tolerance Patient tolerated treatment well    Behavior During Therapy [facility] for tasks assessed/performed                 Past Medical History:  Diagnosis Date   Allergy    Anxiety    Family history of adverse reaction to anesthesia    Father has PONV   Fibroadenoma of right breast    GERD (gastroesophageal reflux disease)    Headache    Kidney stones 2007   PVC's (premature ventricular contractions)    Hx: of   Umbilical hernia    Past Surgical History:  Procedure Laterality Date   BREAST SURGERY  2017   removal of Fibroadenoma   CESAREAN SECTION     CHOLECYSTECTOMY     CYST EXCISION     from lip as a toddler   DILATION AND CURETTAGE OF UTERUS     EYE SURGERY     LASIK     LITHOTRIPSY     MASS EXCISION Right 10/28/2015   Procedure: EXCISION OF RIGHT BREAST FIBROADENOMA;  Surgeon: Avel Peace, MD;  Location: [facility] OR;  Service: General;  Laterality: Right;   WISDOM TOOTH EXTRACTION     Patient Active Problem List   Diagnosis Date Noted   Menopausal syndrome 11/02/2022   Anxiety 06/09/2019   Hypertension 11/27/2016    PCP: Jarold Motto PA  REFERRING PROVIDER: Amador Cunas, PA-C  REFERRING DIAG: M54.2 (ICD-10-CM) - Neck pain  THERAPY DIAG:  Cervicalgia  Pain in left arm  Abnormal posture  Muscle weakness (generalized)  Rationale for Evaluation and Treatment: Rehabilitation  ONSET DATE: Mar 31, 2023  SUBJECTIVE:                                                                                                                                                                                                          SUBJECTIVE STATEMENT: Pt indicated having complaints of tingling into Lt hand to finger with pressure while working on  Lt hand.   PERTINENT HISTORY:  Medical history: Anxiety, GERD, HA,   PAIN:  NPRS scale: 3/10 for cervical.  Lt arm : severe Pain location: see above Pain description: constant, sharp at times, clinching Aggravating factors: work pressure.  Relieving factors: Prednisone  PRECAUTIONS: None  RED FLAGS: None  WEIGHT BEARING RESTRICTIONS: No  FALLS:  Has patient fallen in last 6 months? No  LIVING ENVIRONMENT: Unremarkable for condition  OCCUPATION: Massage therapy (hasn't missed work)   PLOF: Independent, Rt handed, reading.  Walking for exercise.   PATIENT GOALS: Reduce pain    OBJECTIVE:   DIAGNOSTIC FINDINGS:  Xray (cervical spine 4 views): Straightening of the cervical vertebrae without evidence of acute abnormality.  Mild disc space loss and small anterior osteophytes noted between C6-C7.  PATIENT SURVEYS: Patient-Specific Activity Scoring Scheme  "0" represents "unable to perform." "10" represents "able to perform at prior level. 0 1 2 3 4 5 6 7 8 9  10 (Date and Score)   Activity 04/19/2023    1. Work  5/10    2. Sleep  5/10    3. Standing 5/10   4. Sitting 5/10   5.    Score 5/10 avg    Total score = sum of the activity scores/number of activities Minimum detectable change (90%CI) for average score = 2 points Minimum detectable change (90%CI) for single activity score = 3 points    COGNITION: 04/19/2023 Overall cognitive status: Within functional limits for tasks assessed  SENSATION: 04/19/2023 WFL  POSTURE:  04/19/2023 rounded shoulders  PALPATION: 05/22/2023:  Trigger point in Lt infraspinatus and dry needling twitch response did produce Lt UE symptoms.   04/25/2023:  Trigger points still noted in Lt upper trap and  lat near scapula.   04/19/2023 Trigger points c concordant symptoms noted in Lt upper trap, Lt infraspinatus.  Supine cervical downslope mobility assessment WFL s symptoms bilaterally today   CERVICAL ROM:   ROM AROM (deg) 04/19/2023   Flexion 49 c neck pain   Extension 56 c neck pain   Right lateral flexion    Left lateral flexion    Right rotation 75   Left rotation 75    (Blank rows = not tested)  UPPER EXTREMITY ROM:   ROM Right 04/19/2023 Left 04/19/2023  Shoulder flexion [facility]  Shoulder extension    Shoulder abduction    Shoulder adduction    Shoulder extension    Shoulder internal rotation    Shoulder external rotation    Elbow flexion    Elbow extension    Wrist flexion    Wrist extension    Wrist ulnar deviation    Wrist radial deviation    Wrist pronation    Wrist supination     (Blank rows = not tested)  UPPER EXTREMITY MMT:  MMT Right 04/19/2023 Left 04/19/2023  Shoulder flexion 5/5 5/5  Shoulder extension  3+/5 c pain  Shoulder abduction 5/5 4+/5 c neck pain  Shoulder adduction    Shoulder extension    Shoulder internal rotation 5/5 5/5  Shoulder external rotation 5/5 4+/5  Middle trapezius    Lower trapezius    Elbow flexion    Elbow extension    Wrist flexion    Wrist extension    Wrist ulnar deviation    Wrist radial deviation    Wrist pronation    Wrist supination    Grip strength     (Blank rows = not tested)  SPECIAL TESTS:  05/22/2023: Cervical  distraction did not reduce hand symptoms.   04/19/2023 (-) Distraction  cervical   FUNCTIONAL TESTS:  04/19/2023 None performed                                                                                                                                                                                 TODAY'S TREATMENT:                                                                                                       DATE: 05/22/2023 Manual: Percussive device to Lt infraspinatus.  Cervical  distraction intermittent  Trigger Point Dry Needling Subsequent Treatment: Instructions provided previously at initial dry needling treatment.  Patient Verbal Consent Given: Yes Education Handout Provided: Previously Provided Muscles treated: Lt infraspinatus Treatment response/outcome: local twitch response  Moist heat to Lt shoulder during supine cervical distraction performance.     TODAY'S TREATMENT:                                                                                                       DATE: 05/08/23 Manual Compression to Lt upper trap, Lt infraspinatus Trigger Point Dry Needling Initial Treatment: Pt instructed on Dry Needling rational, procedures, and possible side effects. Pt instructed to expect mild to moderate muscle soreness later in the day and/or into the next day.  Pt instructed in methods to reduce muscle soreness. Pt instructed to continue prescribed HEP. Patient verbalized understanding of these instructions and education.  Patient Verbal Consent Given: Yes Education Handout Provided: Yes Muscles treated: Lt upper trap in prone, Left cervical paraspinals, Left infraspinatus Treatment response/outcome: local twitch response noted Mechanical Traction:  Max pull 15# to minimal pull 8# x 20 minutes (cervical protocol)    TODAY'S TREATMENT:  DATE: 05/01/2023 Therex: UBE fwd/rev 4 mins each direction Standing wall postural correction with cervical retraction Standing blue band GH ext 2 x 15 Sidelying left shoulder abduction x 5  Median nerve flossing x 10  Radial nerve flossing x 10  Manual Scapular mobilization, active trigger point release of subscapularis Cervical occipital release and supine grade 2-3 PA mobs to C3-C6 Manual cervical distraction   PATIENT EDUCATION:  04/19/2023 Education details: HEP, POC, DN Person educated: Patient Education  method: Programmer, multimedia, Demonstration, Verbal cues, and Handouts Education comprehension: verbalized understanding, returned demonstration, and verbal cues required  HOME EXERCISE PROGRAM: Access Code: [email or web address] URL: [email or web address] Date: 05/01/2023 Prepared by: Narda Amber  Exercises - Seated Scapular Retraction  - 3-5 x daily - 7 x weekly - 1 sets - 10 reps - 3-5 hold - Seated Upper Trapezius Stretch (Mirrored)  - 2-3 x daily - 7 x weekly - 1 sets - 3-5 reps - 15 hold - Standing Shoulder Posterior Capsule Stretch  - 2-3 x daily - 7 x weekly - 1 sets - 5 reps - 15-30 hold - Prone Scapular Slide with Shoulder Extension  - 1 x daily - 7 x weekly - 1 sets - 10 reps - 5 hold - Prone Scapular Retraction Arms at Side  - 1 x daily - 7 x weekly - 1 sets - 10 reps - 5 hold - Prone Scapular Retraction Y  - 1 x daily - 7 x weekly - 1 sets - 10 reps - 3-5 hold - Median Nerve Flossing - Tray  - 3 x daily - 7 x weekly - 10 reps - Radial Nerve Flossing  - 3 x daily - 7 x weekly - 10 reps - Doorway Rhomboid Stretch  - 3 x daily - 7 x weekly - 3-4 reps - 10 seconds hold  Patient Education - Trigger Point Dry Needling  ASSESSMENT:  CLINICAL IMPRESSION: DN and compression to Lt infraspinatus did produce concordant symptom presentation in Lt UE and hand as described in subjective.  Strong twitch response noted from intervention today.    Decreased treatment time today due to arrival time and request from Pt to leave to get to next scheduled item.   OBJECTIVE IMPAIRMENTS: decreased activity tolerance, decreased coordination, decreased endurance, decreased mobility, decreased ROM, decreased strength, increased fascial restrictions, impaired perceived functional ability, increased muscle spasms, impaired flexibility, impaired UE functional use, improper body mechanics, postural dysfunction, and pain.   ACTIVITY LIMITATIONS: carrying, lifting, bending, sitting, standing, sleeping, and  reach over head  PARTICIPATION LIMITATIONS: meal prep, cleaning, laundry, interpersonal relationship, driving, shopping, community activity, and occupation  PERSONAL FACTORS:  Anxiety, GERD, PVC history medical history may   affect patient's functional outcome.   REHAB POTENTIAL: Good  CLINICAL DECISION MAKING: Stable/uncomplicated  EVALUATION COMPLEXITY: Low   GOALS: Goals reviewed with patient? Yes  SHORT TERM GOALS: (target date for Short term goals are 3 weeks 05/10/2023)  1.Patient will demonstrate independent use of home exercise program to maintain progress from in clinic treatments. Goal status: MET 05/08/23  LONG TERM GOALS: (target dates for all long term goals are 10 weeks  06/28/2023 )   1. Patient will demonstrate/report pain at worst less than or equal to 2/10 to facilitate minimal limitation in daily activity secondary to pain symptoms. Goal status:  on-going 05/22/2023   2. Patient will demonstrate independent use of home exercise program to facilitate ability to maintain/progress functional gains from skilled physical therapy services. Goal status:  on-going 05/22/2023  3. Patient will demonstrate Patient specific functional scale avg > or = 8/10 to indicate reduced disability due to condition.  Goal status:  on-going 05/22/2023   4.  Patient will demonstrate cervical AROM WFL s symptoms to facilitate usual head movements for daily activity including driving, self care.   Goal status:  on-going 05/22/2023   5.  Patient will demonstrate Lt shoulder MMT 5/5 s symptoms.   Goal status:  on-going 05/22/2023     PLAN:  PT FREQUENCY: 1-2x/week  PT DURATION: 10 weeks  Can include 16109- PT Re-evaluation, 97110-Therapeutic exercises, 97530- Therapeutic activity, 97112- Neuromuscular re-education, 97535- Self Care, 97140- Manual therapy, [phone]- Gait training, [phone]- Orthotic Fit/training, [phone]- Canalith repositioning, [id number]- Aquatic Therapy, [phone] Electrical stimulation  (unattended), [phone]- Electrical stimulation (manual), [id number]- Vasopneumatic device, [id number]- Ultrasound, [id number] Physical performance testing, [id number]- Traction (mechanical), [id number]- Ionotophoresis 4mg /ml Dexamethasone, Patient/Family education, Balance training, Stair training, Taping, Dry Needling, Joint mobilization, Joint manipulation, Spinal manipulation, Spinal mobilization, Scar mobilization, Vestibular training, Visual/preceptual remediation/compensation, DME instructions, Cryotherapy, and Moist heat.  All performed as medically necessary.  All included unless contraindicated  PLAN FOR NEXT SESSION: How was DN on infraspinatus.  Check ROM/strength.  Has one more visit scheduled at this time after vacation.   Chyrel Masson, PT, DPT, OCS, ATC 05/22/23  9:25 AM

## 2023-06-04 ENCOUNTER — Ambulatory Visit: Admitting: Rehabilitative and Restorative Service Providers"

## 2023-06-04 ENCOUNTER — Encounter: Payer: Self-pay | Admitting: Rehabilitative and Restorative Service Providers"

## 2023-06-04 DIAGNOSIS — M6281 Muscle weakness (generalized): Secondary | ICD-10-CM | POA: Diagnosis not present

## 2023-06-04 DIAGNOSIS — M79602 Pain in left arm: Secondary | ICD-10-CM

## 2023-06-04 DIAGNOSIS — R293 Abnormal posture: Secondary | ICD-10-CM | POA: Diagnosis not present

## 2023-06-04 DIAGNOSIS — M542 Cervicalgia: Secondary | ICD-10-CM

## 2023-06-04 NOTE — Therapy (Addendum)
 OUTPATIENT PHYSICAL THERAPY TREATMENT / DISCHARGE   Patient Name: Andrea Gray MRN: [id number] DOB:1976-01-26, 48 y.o., female Today's Date: 06/04/2023  END OF SESSION:  PT End of Session - 06/04/23 0843     Visit Number 6    Number of Visits 20    Date for PT Re-Evaluation 06/28/23    Authorization Type [facility] [employer] $25 copay    Progress Note Due on Visit 10    PT Start Time 0843    PT Stop Time 0924    PT Time Calculation (min) 41 min    Activity Tolerance Patient tolerated treatment well    Behavior During Therapy [facility] for tasks assessed/performed                  Past Medical History:  Diagnosis Date   Allergy    Anxiety    Family history of adverse reaction to anesthesia    Father has PONV   Fibroadenoma of right breast    GERD (gastroesophageal reflux disease)    Headache    Kidney stones 2007   PVC's (premature ventricular contractions)    Hx: of   Umbilical hernia    Past Surgical History:  Procedure Laterality Date   BREAST SURGERY  2017   removal of Fibroadenoma   CESAREAN SECTION     CHOLECYSTECTOMY     CYST EXCISION     from lip as a toddler   DILATION AND CURETTAGE OF UTERUS     EYE SURGERY     LASIK     LITHOTRIPSY     MASS EXCISION Right 10/28/2015   Procedure: EXCISION OF RIGHT BREAST FIBROADENOMA;  Surgeon: Krystal Russell, MD;  Location: [facility] OR;  Service: General;  Laterality: Right;   WISDOM TOOTH EXTRACTION     Patient Active Problem List   Diagnosis Date Noted   Menopausal syndrome 11/02/2022   Anxiety 06/09/2019   Hypertension 11/27/2016    PCP: Job Lukes PA  REFERRING PROVIDER: Emiliano Leonce CROME, PA-C  REFERRING DIAG: M54.2 (ICD-10-CM) - Neck pain  THERAPY DIAG:  Cervicalgia  Pain in left arm  Abnormal posture  Muscle weakness (generalized)  Rationale for Evaluation and Treatment: Rehabilitation  ONSET DATE: Mar 31, 2023  SUBJECTIVE:                                                                                                                                                                                                          SUBJECTIVE STATEMENT: Pt indicated good symptom management with trip and since last visit.  Had  days where the pain wasn't present.  Pt indicated some mild changes in finger sensation.  Pt indicated slow but improvement noted in work activity.   PERTINENT HISTORY:  Medical history: Anxiety, GERD, HA,   PAIN:  NPRS scale: at worst in last few days:  3/10 neck/shoulder Pain location: see above Pain description: constant, sharp at times, clinching Aggravating factors: work pressure.  Relieving factors: Prednisone   PRECAUTIONS: None  RED FLAGS: None  WEIGHT BEARING RESTRICTIONS: No  FALLS:  Has patient fallen in last 6 months? No  LIVING ENVIRONMENT: Unremarkable for condition  OCCUPATION: Massage therapy (hasn't missed work)   PLOF: Independent, Rt handed, reading.  Walking for exercise.   PATIENT GOALS: Reduce pain    OBJECTIVE:   DIAGNOSTIC FINDINGS:  Xray (cervical spine 4 views): Straightening of the cervical vertebrae without evidence of acute abnormality.  Mild disc space loss and small anterior osteophytes noted between C6-C7.  PATIENT SURVEYS: Patient-Specific Activity Scoring Scheme  0 represents "unable to perform." 10 represents "able to perform at prior level. 0 1 2 3 4 5 6 7 8 9  10 (Date and Score)   Activity 04/19/2023  06/04/2023  1. Work  5/10  7  2. Sleep  5/10  7  3. Standing 5/10 10  4. Sitting 5/10 6  5.    Score 5/10 avg 7.5 avg   Total score = sum of the activity scores/number of activities Minimum detectable change (90%CI) for average score = 2 points Minimum detectable change (90%CI) for single activity score = 3 points    COGNITION: 04/19/2023 Overall cognitive status: Within functional limits for tasks assessed  SENSATION: 04/19/2023 WFL  POSTURE:  04/19/2023 rounded  shoulders  PALPATION: 05/22/2023:  Trigger point in Lt infraspinatus and dry needling twitch response did produce Lt UE symptoms.   04/25/2023:  Trigger points still noted in Lt upper trap and lat near scapula.   04/19/2023 Trigger points c concordant symptoms noted in Lt upper trap, Lt infraspinatus.  Supine cervical downslope mobility assessment WFL s symptoms bilaterally today   CERVICAL ROM:   ROM AROM (deg) 04/19/2023   Flexion 49 c neck pain   Extension 56 c neck pain   Right lateral flexion    Left lateral flexion    Right rotation 75   Left rotation 75    (Blank rows = not tested)  UPPER EXTREMITY ROM:   ROM Right 04/19/2023 Left 04/19/2023  Shoulder flexion [facility]  Shoulder extension    Shoulder abduction    Shoulder adduction    Shoulder extension    Shoulder internal rotation    Shoulder external rotation    Elbow flexion    Elbow extension    Wrist flexion    Wrist extension    Wrist ulnar deviation    Wrist radial deviation    Wrist pronation    Wrist supination     (Blank rows = not tested)  UPPER EXTREMITY MMT:  MMT Right 04/19/2023 Left 04/19/2023  Shoulder flexion 5/5 5/5  Shoulder extension  3+/5 c pain  Shoulder abduction 5/5 4+/5 c neck pain  Shoulder adduction    Shoulder extension    Shoulder internal rotation 5/5 5/5  Shoulder external rotation 5/5 4+/5  Middle trapezius    Lower trapezius    Elbow flexion    Elbow extension    Wrist flexion    Wrist extension    Wrist ulnar deviation    Wrist radial deviation    Wrist pronation  Wrist supination    Grip strength     (Blank rows = not tested)  SPECIAL TESTS:  05/22/2023: Cervical distraction did not reduce hand symptoms.   04/19/2023 (-) Distraction  cervical   FUNCTIONAL TESTS:  04/19/2023 None performed                                                                                                                                                                                  TODAY'S TREATMENT:                                                                                                       DATE: 06/04/2023 Manual: Percussive device to Lt infraspinatus, Lt upper trap, Lt lat  Cervical distraction intermittent  Trigger Point Dry Needling Subsequent Treatment: Instructions provided previously at initial dry needling treatment.  Patient Verbal Consent Given: Yes Education Handout Provided: Previously Provided Muscles treated: Lt infraspinatus, Lt upper trap Treatment response/outcome: local twitch response  Therex;   UBE fwd/back 3 mins each way lvl 3.0 Wall push up with SA press hold 2 sec 2 x 10  Chest stretch in doorway bilateral 15 sec x 5  Tband green ER c towel under arm 2 x 15, performed bilaterally     TODAY'S TREATMENT:                                                                                                       DATE: 05/22/2023 Manual: Percussive device to Lt infraspinatus.  Cervical distraction intermittent  Trigger Point Dry Needling Subsequent Treatment: Instructions provided previously at initial dry needling treatment.  Patient Verbal Consent Given: Yes Education Handout Provided: Previously Provided Muscles treated: Lt infraspinatus Treatment response/outcome: local twitch response  Moist heat to Lt shoulder during supine cervical distraction performance.     TODAY'S TREATMENT:  DATE: 05/08/23 Manual Compression to Lt upper trap, Lt infraspinatus Trigger Point Dry Needling Initial Treatment: Pt instructed on Dry Needling rational, procedures, and possible side effects. Pt instructed to expect mild to moderate muscle soreness later in the day and/or into the next day.  Pt instructed in methods to reduce muscle soreness. Pt instructed to continue prescribed HEP. Patient verbalized understanding of these instructions and  education.  Patient Verbal Consent Given: Yes Education Handout Provided: Yes Muscles treated: Lt upper trap in prone, Left cervical paraspinals, Left infraspinatus Treatment response/outcome: local twitch response noted Mechanical Traction:  Max pull 15# to minimal pull 8# x 20 minutes (cervical protocol)    TODAY'S TREATMENT:                                                                                                       DATE: 05/01/2023 Therex: UBE fwd/rev 4 mins each direction Standing wall postural correction with cervical retraction Standing blue band GH ext 2 x 15 Sidelying left shoulder abduction x 5  Median nerve flossing x 10  Radial nerve flossing x 10  Manual Scapular mobilization, active trigger point release of subscapularis Cervical occipital release and supine grade 2-3 PA mobs to C3-C6 Manual cervical distraction   PATIENT EDUCATION:  06/04/2023 Education details: HEP update Person educated: Patient Education method: Programmer, multimedia, Demonstration, Verbal cues, and Handouts Education comprehension: verbalized understanding, returned demonstration, and verbal cues required  HOME EXERCISE PROGRAM: Access Code: [email or web address] URL: [email or web address] Date: 06/04/2023 Prepared by: Ozell Silvan  Exercises - Seated Scapular Retraction  - 3-5 x daily - 7 x weekly - 1 sets - 10 reps - 3-5 hold - Seated Upper Trapezius Stretch (Mirrored)  - 2-3 x daily - 7 x weekly - 1 sets - 3-5 reps - 15 hold - Doorway Pec Stretch at 90 Degrees Abduction  - 2-3 x daily - 7 x weekly - 1 sets - 3-5 reps - 15 hold - Standing Shoulder Posterior Capsule Stretch  - 2-3 x daily - 7 x weekly - 1 sets - 5 reps - 15-30 hold - Doorway Rhomboid Stretch  - 2-3 x daily - 7 x weekly - 3-4 reps - 10 seconds hold - Prone Scapular Slide with Shoulder Extension  - 1 x daily - 7 x weekly - 1 sets - 10 reps - 5 hold - Prone Scapular Retraction Arms at Side  - 1 x daily - 7 x weekly - 1  sets - 10 reps - 5 hold - Prone Scapular Retraction Y  - 1 x daily - 7 x weekly - 1 sets - 10 reps - 3-5 hold - Standing Bilateral Low Shoulder Row with Anchored Resistance  - 1-2 x daily - 7 x weekly - 2-3 sets - 10-15 reps - Shoulder Extension with Resistance  - 1-2 x daily - 7 x weekly - 1-2 sets - 10-15 reps - Shoulder External Rotation with Anchored Resistance  - 1-2 x daily - 7 x weekly - 3 sets - 10 reps  ASSESSMENT:  CLINICAL IMPRESSION: Indications of slow but steady improvement  in areas impacted by symptoms.  Patient specific functional scale reassessment showed improvement in rating compared to evaluation.  Based off reporting, expect traction not to be indicated as helpful.  Connection from twitch response.    OBJECTIVE IMPAIRMENTS: decreased activity tolerance, decreased coordination, decreased endurance, decreased mobility, decreased ROM, decreased strength, increased fascial restrictions, impaired perceived functional ability, increased muscle spasms, impaired flexibility, impaired UE functional use, improper body mechanics, postural dysfunction, and pain.   ACTIVITY LIMITATIONS: carrying, lifting, bending, sitting, standing, sleeping, and reach over head  PARTICIPATION LIMITATIONS: meal prep, cleaning, laundry, interpersonal relationship, driving, shopping, community activity, and occupation  PERSONAL FACTORS: Anxiety, GERD, PVC history medical history may  affect patient's functional outcome.   REHAB POTENTIAL: Good  CLINICAL DECISION MAKING: Stable/uncomplicated  EVALUATION COMPLEXITY: Low   GOALS: Goals reviewed with patient? Yes  SHORT TERM GOALS: (target date for Short term goals are 3 weeks 05/10/2023)  1.Patient will demonstrate independent use of home exercise program to maintain progress from in clinic treatments. Goal status: MET 05/08/23  LONG TERM GOALS: (target dates for all long term goals are 10 weeks  06/28/2023 )   1. Patient will demonstrate/report  pain at worst less than or equal to 2/10 to facilitate minimal limitation in daily activity secondary to pain symptoms. Goal status:  on-going 05/22/2023   2. Patient will demonstrate independent use of home exercise program to facilitate ability to maintain/progress functional gains from skilled physical therapy services. Goal status:  on-going 05/22/2023   3. Patient will demonstrate Patient specific functional scale avg > or = 8/10 to indicate reduced disability due to condition.  Goal status:  on-going 05/22/2023   4.  Patient will demonstrate cervical AROM WFL s symptoms to facilitate usual head movements for daily activity including driving, self care.   Goal status:  on-going 05/22/2023   5.  Patient will demonstrate Lt shoulder MMT 5/5 s symptoms.   Goal status:  on-going 05/22/2023     PLAN:  PT FREQUENCY: 1-2x/week  PT DURATION: 10 weeks  Can include 02853- PT Re-evaluation, 97110-Therapeutic exercises, 97530- Therapeutic activity, 97112- Neuromuscular re-education, 97535- Self Care, 97140- Manual therapy, [phone]- Gait training, [phone]- Orthotic Fit/training, [phone]- Canalith repositioning, [id number]- Aquatic Therapy, [phone] Electrical stimulation (unattended), [phone]- Electrical stimulation (manual), [id number]- Vasopneumatic device, [id number]- Ultrasound, [id number] Physical performance testing, [id number]- Traction (mechanical), [id number]- Ionotophoresis 4mg /ml Dexamethasone, Patient/Family education, Balance training, Stair training, Taping, Dry Needling, Joint mobilization, Joint manipulation, Spinal manipulation, Spinal mobilization, Scar mobilization, Vestibular training, Visual/preceptual remediation/compensation, DME instructions, Cryotherapy, and Moist heat.  All performed as medically necessary.  All included unless contraindicated  PLAN FOR NEXT SESSION: DN as desired.  Continued strengthening/postural activation.  Recheck ROM/strength.   Ozell Silvan, PT, DPT, OCS, ATC 06/04/23  9:24 AM  PHYSICAL  THERAPY DISCHARGE SUMMARY  Visits from Start of Care: 6  Current functional level related to goals / functional outcomes: See note   Remaining deficits: See note   Education / Equipment: HEP  Patient goals were partially met. Patient is being discharged due to not returning since the last visit.   Ozell Silvan, PT, DPT, OCS, ATC 08/15/23  9:46 AM

## 2023-06-18 ENCOUNTER — Encounter: Payer: 59 | Admitting: Physician Assistant

## 2023-06-19 ENCOUNTER — Encounter: Admitting: Physical Therapy

## 2023-06-25 ENCOUNTER — Encounter: Admitting: Physical Therapy

## 2023-07-02 ENCOUNTER — Encounter: Admitting: Physical Therapy

## 2023-07-13 ENCOUNTER — Other Ambulatory Visit (HOSPITAL_BASED_OUTPATIENT_CLINIC_OR_DEPARTMENT_OTHER): Payer: Self-pay

## 2023-07-13 ENCOUNTER — Other Ambulatory Visit: Payer: Self-pay

## 2023-08-06 ENCOUNTER — Encounter: Payer: Self-pay | Admitting: Physician Assistant

## 2023-08-08 ENCOUNTER — Encounter: Admitting: Physician Assistant

## 2023-08-20 ENCOUNTER — Other Ambulatory Visit: Payer: Self-pay | Admitting: Physician Assistant

## 2023-08-20 ENCOUNTER — Other Ambulatory Visit (HOSPITAL_BASED_OUTPATIENT_CLINIC_OR_DEPARTMENT_OTHER): Payer: Self-pay

## 2023-08-20 ENCOUNTER — Other Ambulatory Visit: Payer: Self-pay

## 2023-08-20 MED ORDER — AMLODIPINE BESYLATE 2.5 MG PO TABS
2.5000 mg | ORAL_TABLET | Freq: Every day | ORAL | 5 refills | Status: DC
  Filled 2023-08-20: qty 30, 30d supply, fill #0
  Filled 2023-09-19: qty 30, 30d supply, fill #1
  Filled 2023-10-17 – 2023-10-18 (×2): qty 30, 30d supply, fill #2
  Filled 2023-11-16: qty 30, 30d supply, fill #3
  Filled 2023-12-12: qty 30, 30d supply, fill #4
  Filled 2024-01-24: qty 30, 30d supply, fill #5

## 2023-08-29 ENCOUNTER — Ambulatory Visit: Admitting: Physician Assistant

## 2023-09-14 ENCOUNTER — Ambulatory Visit (INDEPENDENT_AMBULATORY_CARE_PROVIDER_SITE_OTHER): Admitting: Physician Assistant

## 2023-09-14 ENCOUNTER — Ambulatory Visit: Admitting: Physician Assistant

## 2023-09-14 VITALS — BP 140/90 | HR 107 | Temp 97.9°F | Ht 64.0 in | Wt 210.0 lb

## 2023-09-14 DIAGNOSIS — F4323 Adjustment disorder with mixed anxiety and depressed mood: Secondary | ICD-10-CM | POA: Diagnosis not present

## 2023-09-14 DIAGNOSIS — E669 Obesity, unspecified: Secondary | ICD-10-CM

## 2023-09-14 DIAGNOSIS — Z0000 Encounter for general adult medical examination without abnormal findings: Secondary | ICD-10-CM | POA: Diagnosis not present

## 2023-09-14 DIAGNOSIS — I10 Essential (primary) hypertension: Secondary | ICD-10-CM

## 2023-09-14 DIAGNOSIS — E785 Hyperlipidemia, unspecified: Secondary | ICD-10-CM | POA: Diagnosis not present

## 2023-09-14 MED ORDER — TIRZEPATIDE-WEIGHT MANAGEMENT 2.5 MG/0.5ML ~~LOC~~ SOLN: 2.5000 mg | SUBCUTANEOUS | 2 refills | Status: DC

## 2023-09-14 NOTE — Progress Notes (Signed)
 Subjective:    Andrea Gray is a 48 y.o. female and is here for a comprehensive physical exam.  HPI  There are no preventive care reminders to display for this patient.  Acute Concerns: Obesity Continues to exercise multiple times a day and eats really well overall Has had ongoing weight gain Interested in Zepbound Planning to discuss hormone replacement therapy with obstetrics-gynecology soon  Chronic Issues: HTN Currently taking amlodipine  2.5 mg. At home blood pressure readings are: normal. Patient denies chest pain, SOB, blurred vision, dizziness, unusual headaches, lower leg swelling. Patient is compliant with medication. Denies excessive caffeine intake, stimulant usage, excessive alcohol intake, or increase in salt consumption.  BP Readings from Last 3 Encounters:  09/14/23 (!) 140/90  01/26/23 124/78  12/07/22 122/80   Situational anxiety and depression Currently taking buspar  5 mg as needed Tolerating well Lots of life stressors -- twin boys going to [employer] in a few weeks for freshman year of college, providing lots of care for aging parents, menopausal  Health Maintenance: Immunizations -- utd Colonoscopy -- Cologuard UpToDate 10/2021 Mammogram -- UpToDate 10/2022 PAP -- UpToDate 08/2020 Bone Density -- n/a Diet -- clean, very little processed/fast food Exercise -- walks daily  Sleep habits -- no major concerns Mood -- see above  UTD with dentist? - yes UTD with eye doctor? - yes  Weight history: Wt Readings from Last 10 Encounters:  09/14/23 210 lb (95.3 kg)  04/04/23 200 lb (90.7 kg)  01/26/23 205 lb 6.4 oz (93.2 kg)  12/07/22 203 lb 4 oz (92.2 kg)  06/14/22 198 lb 4 oz (89.9 kg)  04/12/21 185 lb 4 oz (84 kg)  01/21/20 168 lb 6.1 oz (76.4 kg)  06/09/19 181 lb 4 oz (82.2 kg)  07/30/17 188 lb (85.3 kg)  05/20/17 185 lb (83.9 kg)   Body mass index is 36.05 kg/m. No LMP recorded. Patient is postmenopausal.  Alcohol use:  reports current alcohol use  of about 5.0 standard drinks of alcohol per week.  Tobacco use:  Tobacco Use: Low Risk  (09/14/2023)   Patient History    Smoking Tobacco Use: Never    Smokeless Tobacco Use: Never    Passive Exposure: Not on file   Eligible for lung cancer screening? no     09/14/2023   10:07 AM  Depression screen PHQ 2/9  Decreased Interest 0  Down, Depressed, Hopeless 0  PHQ - 2 Score 0     Other providers/specialists: Patient Care Team: Job Lukes, [place] as PCP - General (Physician Assistant)    PMHx, SurgHx, SocialHx, Medications, and Allergies were reviewed in the Visit Navigator and updated as appropriate.   Past Medical History:  Diagnosis Date   Allergy    Anxiety    Family history of adverse reaction to anesthesia    Father has PONV   Fibroadenoma of right breast    GERD (gastroesophageal reflux disease)    Headache    Hypertension    Kidney stones 2007   PVC's (premature ventricular contractions)    Hx: of   Umbilical hernia      Past Surgical History:  Procedure Laterality Date   BREAST SURGERY  2017   removal of Fibroadenoma   CESAREAN SECTION     CHOLECYSTECTOMY     CYST EXCISION     from lip as a toddler   DILATION AND CURETTAGE OF UTERUS     EYE SURGERY     LASIK     LITHOTRIPSY  MASS EXCISION Right 10/28/2015   Procedure: EXCISION OF RIGHT BREAST FIBROADENOMA;  Surgeon: Krystal Russell, MD;  Location: [facility] OR;  Service: General;  Laterality: Right;   WISDOM TOOTH EXTRACTION       Family History  Problem Relation Age of Onset   Hypertension Mother    COPD Mother    Depression Mother    Diabetes Mother    Anxiety disorder Mother    Obesity Mother    Hypertension Father    Dementia Father    Alcohol abuse Brother    COPD Brother    Depression Brother    Drug abuse Brother    Hypertension Brother    Learning disabilities Brother    Mental illness Brother    ADD / ADHD Brother    Arthritis Maternal Grandmother    Asthma Maternal Grandmother     Hypertension Maternal Grandmother    Alcohol abuse Maternal Grandfather    Hypertension Maternal Grandfather    Arthritis Paternal Grandmother    Cancer Paternal Grandmother        unknown type   Early death Paternal Grandmother    Hearing loss Paternal Grandfather    Breast cancer Other     Social History   Tobacco Use   Smoking status: Never   Smokeless tobacco: Never  Vaping Use   Vaping status: Never Used  Substance Use Topics   Alcohol use: Yes    Alcohol/week: 5.0 standard drinks of alcohol    Types: 5 Glasses of wine per week    Comment: daily wine 1-2 glasses   Drug use: No    Review of Systems:   Review of Systems  Constitutional:  Negative for chills, fever, malaise/fatigue and weight loss.  HENT:  Negative for hearing loss, sinus pain and sore throat.   Respiratory:  Negative for cough and hemoptysis.   Cardiovascular:  Negative for chest pain, palpitations, leg swelling and PND.  Gastrointestinal:  Negative for abdominal pain, constipation, diarrhea, heartburn, nausea and vomiting.  Genitourinary:  Negative for dysuria, frequency and urgency.  Musculoskeletal:  Negative for back pain, myalgias and neck pain.  Skin:  Negative for itching and rash.  Neurological:  Negative for dizziness, tingling, seizures and headaches.  Endo/Heme/Allergies:  Negative for polydipsia.  Psychiatric/Behavioral:  Negative for depression. The patient is not nervous/anxious.     Objective:   BP (!) 140/90 (BP Location: Left Arm, Patient Position: Sitting, Cuff Size: Large)   Pulse (!) 107   Temp 97.9 F (36.6 C) (Temporal)   Ht 5' 4 (1.626 m)   Wt 210 lb (95.3 kg)   SpO2 99%   BMI 36.05 kg/m  Body mass index is 36.05 kg/m.   General Appearance:    Alert, cooperative, no distress, appears stated age  Head:    Normocephalic, without obvious abnormality, atraumatic  Eyes:    PERRL, conjunctiva/corneas clear, EOM's intact, fundi    benign, both eyes  Ears:    Normal  TM's and external ear canals, both ears  Nose:   Nares normal, septum midline, mucosa normal, no drainage    or sinus tenderness  Throat:   Lips, mucosa, and tongue normal; teeth and gums normal  Neck:   Supple, symmetrical, trachea midline, no adenopathy;    thyroid :  no enlargement/tenderness/nodules; no carotid   bruit or JVD  Back:     Symmetric, no curvature, ROM normal, no CVA tenderness  Lungs:     Clear to auscultation bilaterally, respirations unlabored  Chest Wall:  No tenderness or deformity   Heart:    Regular rate and rhythm, S1 and S2 normal, no murmur, rub or gallop  Breast Exam:    Deferred  Abdomen:     Soft, non-tender, bowel sounds active all four quadrants,    no masses, no organomegaly  Genitalia:    Deferred   Extremities:   Extremities normal, atraumatic, no cyanosis or edema  Pulses:   2+ and symmetric all extremities  Skin:   Skin color, texture, turgor normal, no rashes or lesions  Lymph nodes:   Cervical, supraclavicular, and axillary nodes normal  Neurologic:   CNII-XII intact, normal strength, sensation and reflexes    throughout    Assessment/Plan:   Routine physical examination Today patient counseled on age appropriate routine health concerns for screening and prevention, each reviewed and up to date or declined. Immunizations reviewed and up to date or declined. Labs ordered and reviewed. Risk factors for depression reviewed and negative. Hearing function and visual acuity are intact. ADLs screened and addressed as needed. Functional ability and level of safety reviewed and appropriate. Education, counseling and referrals performed based on assessed risks today. Patient provided with a copy of personalized plan for preventive services.  Essential hypertension Above goal today No evidence of end-organ damage on my exam Recommend patient monitor home blood pressure at least a few times weekly Continue amlodipine  2.5 mg daily If home monitoring shows  consistent elevation, or any symptom(s) develop, recommend reach out to us  for further advice on next steps  Hyperlipidemia, unspecified hyperlipidemia type Update lipid panel and provide recommendations   Obesity, unspecified class, unspecified obesity type, unspecified whether serious comorbidity present Continue efforts at healthy lifestyle  Situational mixed anxiety and depressive disorder Declines need for additional prescription or talk therapy Continue buspar  5 mg twice daily  Follow up yearly or sooner if concerns  [name], PA-C [place]

## 2023-09-17 ENCOUNTER — Ambulatory Visit: Payer: Self-pay | Admitting: Physician Assistant

## 2023-09-17 ENCOUNTER — Other Ambulatory Visit (INDEPENDENT_AMBULATORY_CARE_PROVIDER_SITE_OTHER)

## 2023-09-17 DIAGNOSIS — Z13220 Encounter for screening for lipoid disorders: Secondary | ICD-10-CM

## 2023-09-17 DIAGNOSIS — R748 Abnormal levels of other serum enzymes: Secondary | ICD-10-CM

## 2023-09-17 DIAGNOSIS — Z0000 Encounter for general adult medical examination without abnormal findings: Secondary | ICD-10-CM | POA: Diagnosis not present

## 2023-09-17 LAB — LIPID PANEL
Cholesterol: 289 mg/dL — ABNORMAL HIGH (ref 0–200)
HDL: 51.6 mg/dL (ref >39.00)
LDL Cholesterol: 181 mg/dL — ABNORMAL HIGH (ref 0–99)
NonHDL: 237.39
Total CHOL/HDL Ratio: 6
Triglycerides: 283 mg/dL — ABNORMAL HIGH (ref 0.0–149.0)
VLDL: 56.6 mg/dL — ABNORMAL HIGH (ref 0.0–40.0)

## 2023-09-17 LAB — CBC WITH DIFFERENTIAL/PLATELET
Basophils Absolute: 0 K/uL (ref 0.0–0.1)
Basophils Relative: 0.8 % (ref 0.0–3.0)
Eosinophils Absolute: 0.3 K/uL (ref 0.0–0.7)
Eosinophils Relative: 5.5 % — ABNORMAL HIGH (ref 0.0–5.0)
HCT: 42.5 % (ref 36.0–46.0)
Hemoglobin: 14.2 g/dL (ref 12.0–15.0)
Lymphocytes Relative: 30.5 % (ref 12.0–46.0)
Lymphs Abs: 1.6 K/uL (ref 0.7–4.0)
MCHC: 33.4 g/dL (ref 30.0–36.0)
MCV: 99 fl (ref 78.0–100.0)
Monocytes Absolute: 0.5 K/uL (ref 0.1–1.0)
Monocytes Relative: 9.8 % (ref 3.0–12.0)
Neutro Abs: 2.8 K/uL (ref 1.4–7.7)
Neutrophils Relative %: 53.4 % (ref 43.0–77.0)
Platelets: 240 K/uL (ref 150.0–400.0)
RBC: 4.29 Mil/uL (ref 3.87–5.11)
RDW: 12.6 % (ref 11.5–15.5)
WBC: 5.2 K/uL (ref 4.0–10.5)

## 2023-09-17 LAB — COMPREHENSIVE METABOLIC PANEL WITH GFR
ALT: 89 U/L — ABNORMAL HIGH (ref 0–35)
AST: 83 U/L — ABNORMAL HIGH (ref 0–37)
Albumin: 4.6 g/dL (ref 3.5–5.2)
Alkaline Phosphatase: 70 U/L (ref 39–117)
BUN: 15 mg/dL (ref 6–23)
CO2: 28 meq/L (ref 19–32)
Calcium: 9.6 mg/dL (ref 8.4–10.5)
Chloride: 101 meq/L (ref 96–112)
Creatinine, Ser: 0.75 mg/dL (ref 0.40–1.20)
GFR: 93.19 mL/min (ref >60.00)
Glucose, Bld: 106 mg/dL — ABNORMAL HIGH (ref 70–99)
Potassium: 4.2 meq/L (ref 3.5–5.1)
Sodium: 140 meq/L (ref 135–145)
Total Bilirubin: 0.8 mg/dL (ref 0.2–1.2)
Total Protein: 7.4 g/dL (ref 6.0–8.3)

## 2023-09-19 ENCOUNTER — Other Ambulatory Visit (HOSPITAL_BASED_OUTPATIENT_CLINIC_OR_DEPARTMENT_OTHER): Payer: Self-pay

## 2023-09-19 ENCOUNTER — Other Ambulatory Visit: Payer: Self-pay | Admitting: Physician Assistant

## 2023-09-19 ENCOUNTER — Other Ambulatory Visit: Payer: Self-pay

## 2023-09-19 MED ORDER — BUSPIRONE HCL 5 MG PO TABS
5.0000 mg | ORAL_TABLET | Freq: Two times a day (BID) | ORAL | 5 refills | Status: DC
  Filled 2023-09-19: qty 60, 30d supply, fill #0
  Filled 2023-10-17 – 2023-10-18 (×2): qty 60, 30d supply, fill #1
  Filled 2023-11-16: qty 60, 30d supply, fill #2
  Filled 2023-12-12: qty 60, 30d supply, fill #3
  Filled 2024-01-24: qty 60, 30d supply, fill #4

## 2023-10-17 ENCOUNTER — Other Ambulatory Visit: Payer: Self-pay

## 2023-10-17 ENCOUNTER — Encounter: Admitting: Physician Assistant

## 2023-11-13 DIAGNOSIS — Z1231 Encounter for screening mammogram for malignant neoplasm of breast: Secondary | ICD-10-CM | POA: Diagnosis not present

## 2023-11-13 DIAGNOSIS — Z01419 Encounter for gynecological examination (general) (routine) without abnormal findings: Secondary | ICD-10-CM | POA: Diagnosis not present

## 2023-11-13 LAB — HM MAMMOGRAPHY

## 2023-11-16 ENCOUNTER — Encounter: Payer: Self-pay | Admitting: Pharmacist

## 2023-11-16 ENCOUNTER — Other Ambulatory Visit (HOSPITAL_COMMUNITY): Payer: Self-pay

## 2023-11-16 ENCOUNTER — Other Ambulatory Visit: Payer: Self-pay

## 2023-11-29 ENCOUNTER — Other Ambulatory Visit: Payer: Self-pay | Admitting: Physician Assistant

## 2023-11-29 MED ORDER — TIRZEPATIDE-WEIGHT MANAGEMENT 5 MG/0.5ML ~~LOC~~ SOLN: 5.0000 mg | SUBCUTANEOUS | 0 refills | Status: DC

## 2023-12-12 ENCOUNTER — Other Ambulatory Visit (HOSPITAL_COMMUNITY): Payer: Self-pay

## 2023-12-13 ENCOUNTER — Other Ambulatory Visit: Payer: Self-pay

## 2023-12-13 ENCOUNTER — Other Ambulatory Visit (HOSPITAL_COMMUNITY): Payer: Self-pay

## 2023-12-13 MED ORDER — PANTOPRAZOLE SODIUM 40 MG PO TBEC
40.0000 mg | DELAYED_RELEASE_TABLET | Freq: Every day | ORAL | 4 refills | Status: AC
  Filled 2023-12-13: qty 90, 90d supply, fill #0
  Filled 2024-01-24 – 2024-02-18 (×2): qty 90, 90d supply, fill #1

## 2023-12-17 ENCOUNTER — Encounter: Payer: Self-pay | Admitting: Radiology

## 2023-12-18 DIAGNOSIS — N950 Postmenopausal bleeding: Secondary | ICD-10-CM | POA: Diagnosis not present

## 2023-12-18 DIAGNOSIS — D250 Submucous leiomyoma of uterus: Secondary | ICD-10-CM | POA: Diagnosis not present

## 2023-12-24 ENCOUNTER — Encounter: Payer: Self-pay | Admitting: Physician Assistant

## 2023-12-25 ENCOUNTER — Other Ambulatory Visit (HOSPITAL_BASED_OUTPATIENT_CLINIC_OR_DEPARTMENT_OTHER): Payer: Self-pay

## 2023-12-25 ENCOUNTER — Other Ambulatory Visit: Payer: Self-pay | Admitting: Physician Assistant

## 2023-12-25 MED ORDER — OSELTAMIVIR PHOSPHATE 75 MG PO CAPS
75.0000 mg | ORAL_CAPSULE | Freq: Every day | ORAL | 0 refills | Status: AC
  Filled 2023-12-25: qty 10, 10d supply, fill #0

## 2024-01-02 ENCOUNTER — Other Ambulatory Visit: Payer: Self-pay | Admitting: Physician Assistant

## 2024-01-24 ENCOUNTER — Other Ambulatory Visit: Payer: Self-pay

## 2024-01-25 ENCOUNTER — Other Ambulatory Visit: Payer: Self-pay

## 2024-01-27 ENCOUNTER — Other Ambulatory Visit (HOSPITAL_COMMUNITY): Payer: Self-pay

## 2024-01-28 ENCOUNTER — Other Ambulatory Visit: Payer: Self-pay

## 2024-01-30 ENCOUNTER — Encounter: Payer: Self-pay | Admitting: Physician Assistant

## 2024-01-30 ENCOUNTER — Other Ambulatory Visit: Payer: Self-pay | Admitting: Physician Assistant

## 2024-02-06 ENCOUNTER — Other Ambulatory Visit: Payer: Self-pay | Admitting: Physician Assistant

## 2024-02-06 ENCOUNTER — Other Ambulatory Visit: Payer: Self-pay

## 2024-02-06 ENCOUNTER — Other Ambulatory Visit (HOSPITAL_BASED_OUTPATIENT_CLINIC_OR_DEPARTMENT_OTHER): Payer: Self-pay

## 2024-02-06 MED ORDER — AMLODIPINE BESYLATE 2.5 MG PO TABS
2.5000 mg | ORAL_TABLET | Freq: Every day | ORAL | 1 refills | Status: AC
  Filled 2024-02-06 – 2024-02-18 (×2): qty 90, 90d supply, fill #0

## 2024-02-06 MED ORDER — FLUTICASONE PROPIONATE 50 MCG/ACT NA SUSP
2.0000 | Freq: Every day | NASAL | 6 refills | Status: AC
  Filled 2024-02-06: qty 16, 30d supply, fill #0

## 2024-02-06 MED ORDER — PREGABALIN 25 MG PO CAPS
25.0000 mg | ORAL_CAPSULE | Freq: Two times a day (BID) | ORAL | 1 refills | Status: AC
  Filled 2024-02-06: qty 30, 15d supply, fill #0

## 2024-02-06 MED ORDER — BUSPIRONE HCL 5 MG PO TABS
5.0000 mg | ORAL_TABLET | Freq: Two times a day (BID) | ORAL | 1 refills | Status: AC
  Filled 2024-02-06 – 2024-02-18 (×2): qty 180, 90d supply, fill #0

## 2024-02-06 MED ORDER — CYCLOBENZAPRINE HCL 5 MG PO TABS
5.0000 mg | ORAL_TABLET | Freq: Three times a day (TID) | ORAL | 1 refills | Status: AC | PRN
  Filled 2024-02-06: qty 30, 5d supply, fill #0

## 2024-02-16 ENCOUNTER — Other Ambulatory Visit (HOSPITAL_BASED_OUTPATIENT_CLINIC_OR_DEPARTMENT_OTHER): Payer: Self-pay

## 2024-02-19 ENCOUNTER — Other Ambulatory Visit: Payer: Self-pay

## 2024-02-19 ENCOUNTER — Other Ambulatory Visit (HOSPITAL_COMMUNITY): Payer: Self-pay

## 2024-02-22 ENCOUNTER — Telehealth: Admitting: Physician Assistant

## 2024-02-22 ENCOUNTER — Other Ambulatory Visit (HOSPITAL_BASED_OUTPATIENT_CLINIC_OR_DEPARTMENT_OTHER): Payer: Self-pay

## 2024-02-22 VITALS — Ht 64.0 in | Wt 190.0 lb

## 2024-02-22 DIAGNOSIS — J019 Acute sinusitis, unspecified: Secondary | ICD-10-CM | POA: Diagnosis not present

## 2024-02-22 DIAGNOSIS — R051 Acute cough: Secondary | ICD-10-CM

## 2024-02-22 MED ORDER — DOXYCYCLINE HYCLATE 100 MG PO TABS
100.0000 mg | ORAL_TABLET | Freq: Two times a day (BID) | ORAL | 0 refills | Status: AC
  Filled 2024-02-22: qty 14, 7d supply, fill #0

## 2024-02-22 NOTE — Progress Notes (Signed)
 "   Virtual Visit via Video Note   I, [name], connected with  Andrea Gray  ([id number], [birth year]) on 02/22/2024 at 11:20 AM EST by a video-enabled telemedicine application and verified that I am speaking with the correct person using two identifiers.  Location: Patient: Home Provider: [facility] [place] office   I discussed the limitations of evaluation and management by telemedicine and the availability of in person appointments. The patient expressed understanding and agreed to proceed.    Discussed the use of AI scribe software for clinical note transcription with the patient, who gave verbal consent to proceed.  History of Present Illness   [name] is a 49 year old female who presents with symptoms of a sinus infection.  She reports recurrent annual sinus symptoms with dental pain. Saline spray gives only temporary relief. She is allergic to penicillin and usually uses doxycycline  for these infections. Reports there is no fevers/chills, shortness of breath, severe pain.   Problems:  Patient Active Problem List   Diagnosis Date Noted   Menopausal syndrome 11/02/2022   Anxiety 06/09/2019   Hypertension 11/27/2016    Allergies: Allergies[1] Medications: Current Medications[2]  Observations/Objective: Patient is well-developed, well-nourished in no acute distress.  Resting comfortably  at home.  Head is normocephalic, atraumatic.  No labored breathing.  Speech is clear and coherent with logical content.  Patient is alert and oriented at baseline.    Assessment and Plan    Acute sinusitis Recurrent sinusitis with dental pain, likely bacterial due to annual recurrence and saline spray ineffectiveness. - Prescribed doxycycline . Reviewed return precautions including new or worsening fever, SOB, new or worsening cough or other concerns.  Push fluids and rest.  I recommend that patient follow-up if symptoms worsen or persist despite treatment x 7-10 days,  sooner if needed.  Follow Up Instructions: I discussed the assessment and treatment plan with the patient. The patient was provided an opportunity to ask questions and all were answered. The patient agreed with the plan and demonstrated an understanding of the instructions.  A copy of instructions were sent to the patient via MyChart unless otherwise noted below.   The patient was advised to call back or seek an in-person evaluation if the symptoms worsen or if the condition fails to improve as anticipated.  [name], PA    [1]  Allergies Allergen Reactions   Penicillins     Has patient had a PCN reaction causing immediate rash, facial/tongue/throat swelling, SOB or lightheadedness with hypotension: Yes Has patient had a PCN reaction causing severe rash involving mucus membranes or skin necrosis: No Has patient had a PCN reaction that required hospitalization Yes Has patient had a PCN reaction occurring within the last 10 years: No If all of the above answers are NO, then may proceed with Cephalosporin use.    [2]  Current Outpatient Medications:    amLODipine  (NORVASC ) 2.5 MG tablet, Take 1 tablet (2.5 mg total) by mouth daily., Disp: 90 tablet, Rfl: 1   B Complex Vitamins (B COMPLEX PO), Take 1 tablet by mouth daily in the afternoon., Disp: , Rfl:    busPIRone  (BUSPAR ) 5 MG tablet, Take 1 tablet (5 mg total) by mouth 2 (two) times daily. Needs appointment., Disp: 180 tablet, Rfl: 1   clobetasol  cream (TEMOVATE ) 0.05 %, Apply topically to skin twice daily in the morning and evening.  Maximum use is 2 weeks (Patient taking differently: Apply 1 Application topically as needed.), Disp: 60 g, Rfl:  1   Collagen Hydrolysate POWD, 1 Scoop by Does not apply route daily in the afternoon., Disp: , Rfl:    cyclobenzaprine  (FLEXERIL ) 5 MG tablet, Take 1-2 tablets (5-10 mg total) by mouth 3 (three) times daily as needed for muscle spasms., Disp: 30 tablet, Rfl: 1   doxycycline  (VIBRA -TABS)  100 MG tablet, Take 1 tablet (100 mg total) by mouth 2 (two) times daily., Disp: 14 tablet, Rfl: 0   fluticasone  (FLONASE ) 50 MCG/ACT nasal spray, Place 2 sprays into both nostrils daily., Disp: 16 g, Rfl: 6   ibuprofen (ADVIL,MOTRIN) 200 MG tablet, Take 400 mg by mouth every 6 (six) hours as needed for mild pain., Disp: , Rfl:    Multiple Vitamin (MULTIVITAMIN) tablet, Take 1 tablet by mouth daily., Disp: 90 tablet, Rfl: 3   oseltamivir  (TAMIFLU ) 75 MG capsule, Take 1 capsule (75 mg total) by mouth daily., Disp: 10 capsule, Rfl: 0   pantoprazole  (PROTONIX ) 40 MG tablet, Take 1 tablet (40 mg total) by mouth daily., Disp: 95 tablet, Rfl: 4   pregabalin  (LYRICA ) 25 MG capsule, Take 1 capsule (25 mg total) by mouth 2 (two) times daily., Disp: 30 capsule, Rfl: 1   triamcinolone  cream (KENALOG ) 0.1 %, Apply topically 2 (two) times daily., Disp: 454 g, Rfl: 1   ZEPBOUND  5 MG/0.5ML injection vial, INJECT 0.5 ML (5 MG) UNDER THE SKIN ONCE WEEKLY (0.5ML= 50 UNITS), Disp: 2 mL, Rfl: 0  "

## 2024-02-28 ENCOUNTER — Other Ambulatory Visit: Payer: Self-pay | Admitting: Physician Assistant

## 2024-03-01 ENCOUNTER — Other Ambulatory Visit (HOSPITAL_BASED_OUTPATIENT_CLINIC_OR_DEPARTMENT_OTHER): Payer: Self-pay

## 2024-09-15 ENCOUNTER — Encounter: Admitting: Physician Assistant
